# Patient Record
Sex: Female | Born: 1980
Health system: Southern US, Community
[De-identification: ages and names within clinical notes are randomized; demographics above are authoritative.]

## PROBLEM LIST (undated history)

## (undated) DIAGNOSIS — L409 Psoriasis, unspecified: Secondary | ICD-10-CM

## (undated) HISTORY — DX: Psoriasis, unspecified: L40.9

## (undated) HISTORY — PX: CHOLECYSTECTOMY: SHX55

## (undated) HISTORY — PX: OTHER SURGICAL HISTORY: SHX169

## (undated) HISTORY — PX: FOOT SURGERY: SHX648

---

## 2005-10-21 ENCOUNTER — Emergency Department: Payer: Self-pay | Admitting: Emergency Medicine

## 2007-06-03 ENCOUNTER — Inpatient Hospital Stay (HOSPITAL_COMMUNITY): Admission: EM | Admit: 2007-06-03 | Discharge: 2007-06-06 | Payer: Self-pay | Admitting: Emergency Medicine

## 2007-06-05 ENCOUNTER — Encounter (INDEPENDENT_AMBULATORY_CARE_PROVIDER_SITE_OTHER): Payer: Self-pay | Admitting: General Surgery

## 2007-12-11 ENCOUNTER — Other Ambulatory Visit: Admission: RE | Admit: 2007-12-11 | Discharge: 2007-12-11 | Payer: Self-pay | Admitting: Obstetrics and Gynecology

## 2009-05-04 ENCOUNTER — Other Ambulatory Visit: Admission: RE | Admit: 2009-05-04 | Discharge: 2009-05-04 | Payer: Self-pay | Admitting: Obstetrics and Gynecology

## 2009-10-27 ENCOUNTER — Emergency Department (HOSPITAL_COMMUNITY): Admission: EM | Admit: 2009-10-27 | Discharge: 2009-10-28 | Payer: Self-pay | Admitting: Emergency Medicine

## 2010-03-23 ENCOUNTER — Other Ambulatory Visit: Admission: RE | Admit: 2010-03-23 | Discharge: 2010-03-23 | Payer: Self-pay | Admitting: Obstetrics and Gynecology

## 2010-05-20 ENCOUNTER — Ambulatory Visit: Payer: Self-pay | Admitting: Advanced Practice Midwife

## 2010-08-16 ENCOUNTER — Inpatient Hospital Stay (HOSPITAL_COMMUNITY)
Admission: AD | Admit: 2010-08-16 | Discharge: 2010-08-16 | Payer: Self-pay | Source: Home / Self Care | Admitting: Family Medicine

## 2010-08-18 ENCOUNTER — Inpatient Hospital Stay (HOSPITAL_COMMUNITY): Admission: AD | Admit: 2010-08-18 | Discharge: 2010-05-20 | Payer: Self-pay | Admitting: Obstetrics & Gynecology

## 2010-09-24 ENCOUNTER — Inpatient Hospital Stay (HOSPITAL_COMMUNITY)
Admission: AD | Admit: 2010-09-24 | Discharge: 2010-09-26 | Payer: Self-pay | Source: Home / Self Care | Attending: Obstetrics & Gynecology | Admitting: Obstetrics & Gynecology

## 2010-09-26 LAB — CBC
HCT: 34.8 % — ABNORMAL LOW (ref 36.0–46.0)
Hemoglobin: 11.6 g/dL — ABNORMAL LOW (ref 12.0–15.0)
MCH: 28.4 pg (ref 26.0–34.0)
MCHC: 33.3 g/dL (ref 30.0–36.0)
MCV: 85.1 fL (ref 78.0–100.0)
Platelets: 213 10*3/uL (ref 150–400)
RBC: 4.09 MIL/uL (ref 3.87–5.11)
RDW: 14.3 % (ref 11.5–15.5)
WBC: 9.7 10*3/uL (ref 4.0–10.5)

## 2010-09-26 LAB — RPR: RPR Ser Ql: NONREACTIVE

## 2010-10-03 NOTE — Op Note (Signed)
Kaitlyn Carpenter, Kaitlyn Carpenter               ACCOUNT NO.:  [id number]  MEDICAL RECORD NO.:  [id number]          PATIENT TYPE:  INP  LOCATION:  9105                          FACILITY:  [facility]  PHYSICIAN:  Tanya S. Shawnie Pons, M.D.   DATE OF BIRTH:  04-24-1981  DATE OF PROCEDURE:  09/25/2010 DATE OF DISCHARGE:                              OPERATIVE REPORT   PREOPERATIVE DIAGNOSES: 1. Multiparity. 2. Undesired fertility.  POSTOPERATIVE DIAGNOSES: 1. Multiparity. 2. Undesired fertility. 3. Umbilical hernia.  PROCEDURES: 1. Postpartum tubal ligation with Filshie clips. 2. Umbilical hernia repair.  SURGEON:  Shelbie Proctor. Shawnie Pons, MD  ASSISTANT:  None.  ANESTHESIA:  Epidural local, Dana Allan, MD  FINDINGS:  Normal tubes and umbilical hernia.  SPECIMENS:  None.  COMPLICATIONS:  None known.  ESTIMATED BLOOD LOSS:  Minimal.  REASON FOR PROCEDURE:  Briefly, the patient is a 30 year old gravida 3, para 2 who was postpartum day #1 from her vaginal delivery.  She has had 3 children, does not desire any more.  She was counseled regarding risks, benefits of this procedure including permanency of the procedure, risk of failure 1:100, increased risk of ectopic.  Additionally, the patient had previous laparoscopic cholecystectomy and subsequent umbilical hernia that she would like repaired and was told that it could be fixed at the time of tubal ligation.  She was counseled about standard care being mesh for umbilical hernia, but that I would do our best to see what I can get closed, but I would not guarantee that whenever I did with last.  DESCRIPTION OF PROCEDURE:  The patient was taken to the OR.  She was placed in a supine position.  She was prepped and draped in usual sterile fashion.  A Foley catheter was placed inside her bladder. Epidural analgesia was dosed.  When it was adequate, she was injected with 7 mL of 0.25% Marcaine infraumbilically.  Two Allis clamps were used to elevate the skin  and a knife was used to make an incision. Incision was carried down through the underlying fascia and the peritoneal cavity which were entered sharply.  [employer] retractors were then used inside the abdomen and the patient was placed in Trendelenburg.  The patient's left tube was identified, grasped with a Babcock clamp, followed to its fimbriated end.  A Filshie clip was placed across this tube at approximately 1-1/2 cm from the cornu.  A 1 mL of Marcaine was drizzled over the tube.  Similarly, the patient's right tube was identified, grasped with a Babcock clamp, followed to its fimbriated end.  A Filshie clip was placed 1-1/2 cm from the cornu on this tube as well and 1 mL of 0.25% Marcaine was drizzled over this tube as well.  Both tubes were then allow to return to the abdomen and instruments were removed.  The patient was flattened.  Allis clamp was used to grasp either end of the fascia.  At this point, a defect at the umbilicus was noted above the superior edge of the fascial incision. This was grasped with a pickup and a figure-of-eight was placed through this incision with 0 Vicryl suture.  Similarly,  the fascial defect was closed with 0 Vicryl suture in a running fashion.  The skin was closed with 3-0 Vicryl in a subcuticular fashion.  All instrument and lap counts correct x2.  The patient was awakened and taken to the recovery room in stable condition.     Shelbie Proctor. Shawnie Pons, M.D.     TSP/MEDQ  D:  09/25/2010  T:  09/26/2010  Job:  [id number]  Electronically Signed by Tinnie Gens M.D. on 10/03/2010 12:08:43 PM

## 2010-11-22 ENCOUNTER — Encounter: Payer: Self-pay | Admitting: Orthopedic Surgery

## 2010-11-22 LAB — URINALYSIS, ROUTINE W REFLEX MICROSCOPIC
Bilirubin Urine: NEGATIVE
Glucose, UA: NEGATIVE mg/dL
Ketones, ur: >80 mg/dL — AB
Leukocytes, UA: NEGATIVE
Nitrite: NEGATIVE
Protein, ur: 30 mg/dL — AB
Specific Gravity, Urine: >1.03 — ABNORMAL HIGH (ref 1.005–1.030)
Urobilinogen, UA: 1 mg/dL (ref 0.0–1.0)
pH: 6 (ref 5.0–8.0)

## 2010-11-22 LAB — URINE MICROSCOPIC-ADD ON

## 2010-12-20 ENCOUNTER — Encounter: Payer: Self-pay | Admitting: Orthopedic Surgery

## 2010-12-20 ENCOUNTER — Ambulatory Visit: Payer: Self-pay | Admitting: Orthopedic Surgery

## 2011-01-24 NOTE — H&P (Signed)
NAMEYUNIQUE, DEARCOS NO.:  [id number]   MEDICAL RECORD NO.:  [id number]          PATIENT TYPE:  INP   LOCATION:  A313                          FACILITY:  [facility]   PHYSICIAN:  Barbaraann Barthel, M.D. DATE OF BIRTH:  16-Dec-1980   DATE OF ADMISSION:  06/03/2007  DATE OF DISCHARGE:  LH                              HISTORY & PHYSICAL   NOTE:  Surgery was asked to see this 30 year old white female for right  upper quadrant pain and nausea.  Chief complaint is that of right upper  quadrant pain with nausea for approximately 6 months in duration.  This  pain occurred 3 or 4 hours after eating and radiated to her back and was  accompanied with severe nausea.  This had recurred over the last 6  months, the worst episode being the one that occurred last evening,  which necessitated her admission through the emergency room today.  She  had a CT scan ordered by the emergency room physician.  This was normal  except that it did show some inflammation around the gallbladder with  stone noted.  A sonogram of the gallbladder was then performed and it  showed that the common bile duct was within normal limits and there were  at least two stones identified within the gallbladder.  Liver function  studies were also was grossly within normal limits.  Her AST was 19, ALT  17, alkaline phosphatase is 79 and total bilirubin 0.5.  White count was  9.9 with an H&H of 13.5 and 39.3.  her Metabolic-7 was within normal  limits and her urine pregnancy test was negative.  She did have some  epithelial cells and a small amount of pyuria in her urine.   Surgery was consulted.  We admitted the patient as the patient was very  tender in the right upper quadrant, and we will plan to do surgery on  her for gallbladder problems during this admission.   PHYSICAL EXAMINATION:  A pleasant 30 year old white female who is  uncomfortable but in no acute distress.  Head is normocephalic.  Eyes:  Extraocular  movements are intact.  Pupils  are round and react to light and accommodation.  There is no conjunctive  pallor or scleral injection.  Sclerae are of normal tincture.  Nose is  pierced, as is the tongue.  Otherwise the oral mucosa is moist.  NECK:  Supple and cylindrical without jugular vein distention,  thyromegaly or tracheal deviation.  There is no cervical adenopathy, and  no bruits are auscultated.  CHEST:  Clear both anterior and posterior to auscultation.  HEART:  Regular rhythm.  BREASTS AND AXILLAE:  Without masses.  ABDOMEN:  The patient is tender in the right upper quadrant.  The  patient has a scar around her umbilicus consistent with previous  piercing.  No femoral or inguinal hernias are appreciated.  RECTAL:  Not repeated.  This was done in the emergency room without any  findings.  EXTREMITIES:  No clubbing, cyanosis or varicosities.  The patient has  some tattoos.  Pulses are full and symmetrical.  REVIEW OF SYSTEMS:  GI SYSTEM:  Right upper quadrant pain and nausea of  6 months' duration.  No past history of hepatitis.  No history of  inflammatory bowel disease.  No history of bright red rectal bleeding,  black tarry stools, diarrhea or constipation.  GU SYSTEM:  No history of  nephrolithiasis or dysuria.  ENDOCRINE SYSTEM:  No history of thyroid  disease or diabetes.  CARDIORESPIRATORY SYSTEM:  Grossly within normal  limits.   The patient does smoke approximately half a pack of cigarettes per day.  She is a social drinker.  She does not use any recreational drugs.   MEDICATIONS:  None.   She is allergic to CEPHALOSPORINS.   OB/GYN HISTORY:  The patient is a gravida 2, para 2, abortus 0, cesarean  0, female whose pregnancy test is negative.   She had a maternal grandmother who had carcinoma of the breast.   IMPRESSION:  Ms. Maple Hudson is a 30 year old white female who has recurrent  gallbladder colic.  We have discussed surgery with her in detail,  discussing  complications not limited to but including bleeding,  infection, damage to bile ducts, perforation of organs, transitory  diarrhea, and the possibility that an open cholecystectomy may be  required.  Informed consent was obtained.  We will continue hydration.  She has been placed on antibiotics and we will plan for surgery during  this hospitalization.      Barbaraann Barthel, M.D.  Electronically Signed     WB/MEDQ  D:  06/03/2007  T:  06/04/2007  Job:  [id number]   cc:   Rhae Lerner. Margretta Ditty, M.D.  [address]  [place]  [place] [address]

## 2011-01-24 NOTE — Discharge Summary (Signed)
Kaitlyn Carpenter, Kaitlyn Carpenter               ACCOUNT NO.:  [id number]   MEDICAL RECORD NO.:  [id number]          PATIENT TYPE:  INP   LOCATION:  A313                          FACILITY:  [facility]   PHYSICIAN:  Barbaraann Barthel, M.D. DATE OF BIRTH:  12/28/1980   DATE OF ADMISSION:  06/03/2007  DATE OF DISCHARGE:  09/25/2008LH                               DISCHARGE SUMMARY   PROCEDURE:  On June 05, 2007, laparoscopic cholecystectomy.   DIAGNOSIS:  Cholecystitis, cholelithiasis.   SECONDARY DIAGNOSIS:  Allergic dermatitis secondary to poison oak.   Note, this is a 30 year old white female was admitted through the  emergency room with right upper quadrant pain and nausea for the last 6  months.  Sonogram revealed the presence of cholelithiasis.  She was she  was started on antibiotics and rehydrated and she was taken to surgery  on June 05, 2007, at which time laparoscopic cholecystectomy was  performed.  She did very well after surgery and was discharged on the  first postoperative day.  At the time of discharge her wounds were  clean.  She was voiding well.  She had no dysuria or leg pain or  shortness of breath and she had minimal incisional discomfort.   LABORATORY DATA:  As stated, sonography revealed the presence of stones.  A CT scan also revealed the presence of a stone and some inflammation.  Her liver function studies were grossly within normal limits, mild  elevation of AST, ALT.  However, her amylase and lipase were not  elevated and her bilirubin was normal.   HOSPITAL COURSE:  Unremarkable.  The patient did well.  Her diet and  activity were advanced as tolerated and she was doing well.  Her liver  function studies remained within normal limits postoperatively, and she  was discharged on the first postoperative day.  Incidentally, she had  just contracted allergic dermatitis from poison oak, just prior to her  admission, and this responded to Caladryl lotion and we have  discharged  her on over-the-counter steroid cream, and this appears to be doing  well.  We will follow up with her perioperatively after which she is  told to follow-up with her own doctor of her choosing.  She has no  physician that she sees on a regular basis.  We will follow up with her  perioperatively obviously.   DISCHARGE INSTRUCTIONS:  She is excused from work.  She is discharged on  a full liquid and soft diet.  She is told to increase her activity as  tolerated.  She is permitted to go up stairs and down stairs.  She is  permitted to  shower.  No heavy lifting, no driving, no sexual activities.  She is  discharged on Darvocet N-100 one tablet every 4 hours as needed for  pain, and she is to continue her Xanax 0.25 mg as needed and Benadryl 50  mg every 8 hours as needed for her for poison oak as well as her  cortisone cream.  We will follow her perioperatively.      Barbaraann Barthel, M.D.  Electronically Signed  WB/MEDQ  D:  06/06/2007  T:  06/07/2007  Job:  [id number]   cc:   Rhae Lerner. Margretta Ditty, M.D.  [address]  [place]  [place] [address]

## 2011-01-24 NOTE — Op Note (Signed)
NAMEHANH, KERTESZ NO.:  [id number]   MEDICAL RECORD NO.:  [id number]          PATIENT TYPE:  INP   LOCATION:  A313                          FACILITY:  [facility]   PHYSICIAN:  Barbaraann Barthel, M.D. DATE OF BIRTH:  [birth year]   DATE OF PROCEDURE:  06/05/2007  DATE OF DISCHARGE:                               OPERATIVE REPORT   SURGEON:  Barbaraann Barthel, M.D.   PREOPERATIVE DIAGNOSIS:  Cholecystitis, cholelithiasis.   POSTOPERATIVE DIAGNOSIS:  Cholecystitis, cholelithiasis.   PROCEDURE:  Laparoscopic cholecystectomy.   NOTE:  This is a 30 year old white female who had a six month history of  abdominal pain.  She was admitted through the emergency room with  biliary colic.  She was admitted, hydrated, antibiotics were initiated,  and her pain has subsided. Her liver function studies were within normal  limits.  Sonogram revealed the presence of gallstones.  As she improved  clinically, we scheduled her for surgery on June 05, 2007.  We  discussed the surgery with her in detail discussing complications not  limited to but including bleeding, infection, damage to bile ducts,  perforation of organs and transitory diarrhea.  Informed consent was  obtained.   GROSS OPERATIVE FINDINGS:  The patient had rather dense adhesions around  Hartmann's pouch, a small cystic duct which was not cannulated, at least  one stone within the gallbladder.  The right upper quadrant, otherwise,  appeared to be normal.  The gallbladder was intrahepatic in nature and  twisted with its adhesions.  Otherwise, the right upper quadrant  laparoscopically was grossly within normal limits.   SPECIMEN:  Gallbladder with stones.   TECHNIQUE:  The patient was placed in the supine position. After the  adequate administration of general anesthesia via endotracheal  intubation, her entire abdomen was prepped with Betadine solution and  draped in the usual manner.  Prior to this, a Foley  catheter was  aseptically inserted.  An incision was carried out over the superior  aspect of the umbilicus.  The skin and subcutaneous tissue was incised  down to the fascia which was elevated with a sharp towel clip. The  Veress needle was inserted and confirmed in position with a saline drop  test.  We then placed an 11 mm cannula using the Visiport technique  through the umbilicus and then under direct vision, an 11 mm cannula was  placed in the epigastrium and two 5 mm cannulae in the right upper  quadrant laterally.   The gallbladder was grasped.  Adhesions were taken down and we removed  the gallbladder after identifying clearly the cystic duct which was  triply silver clipped on the side of the common bile duct as well as the  cystic artery.  There was an inflamed lymph node of Calot present, as  well.  The gallbladder was then removed from the liver bed.  The  gallbladder was intrahepatic in nature and this was tedious dissection,  however, we did this without any spillage whatsoever. The gallbladder  was then removed using the Endosac device.  We irrigated with normal  saline.  I  elected to leave Surgicel within the liver bed and we then  desufflated the abdomen.  I did not feel it necessary to leave a drain.  We then desufflated the abdomen after checking for hemostasis and then  closed the 11 mm cannulae ports with 0 Polysorb suture using 0.5%  Sensorcaine at all port sites for comfort and then closed the skin with  the stapling device.   Prior to closure, all sponge, needle, and instrument counts were found  to be correct.  Estimated blood loss was minimal.  The patient received  800 mL crystalloids intraoperatively.  There were no complications.      Barbaraann Barthel, M.D.  Electronically Signed     WB/MEDQ  D:  06/05/2007  T:  06/05/2007  Job:  [id number]   cc:   Rhae Lerner. Margretta Ditty, M.D.  [address]  [place]  [place] [address]

## 2011-06-22 LAB — AMYLASE: Amylase: 47

## 2011-06-22 LAB — BASIC METABOLIC PANEL
BUN: 5 — ABNORMAL LOW
BUN: 9
CO2: 24
CO2: 25
Calcium: 8.6
Calcium: 9.4
Chloride: 107
Chloride: 110
Creatinine, Ser: 0.8
Creatinine, Ser: 0.86
GFR calc Af Amer: >60
GFR calc Af Amer: >60
GFR calc non Af Amer: >60
GFR calc non Af Amer: >60
Glucose, Bld: 100 — ABNORMAL HIGH
Glucose, Bld: 104 — ABNORMAL HIGH
Potassium: 4
Potassium: 4
Sodium: 138
Sodium: 140

## 2011-06-22 LAB — URINALYSIS, ROUTINE W REFLEX MICROSCOPIC
Bilirubin Urine: NEGATIVE
Glucose, UA: NEGATIVE
Ketones, ur: NEGATIVE
Leukocytes, UA: NEGATIVE
Nitrite: NEGATIVE
Protein, ur: NEGATIVE
Specific Gravity, Urine: 1.01
Urobilinogen, UA: 0.2
pH: 6

## 2011-06-22 LAB — DIFFERENTIAL
Basophils Absolute: 0
Basophils Absolute: 0
Basophils Relative: 0
Basophils Relative: 0
Eosinophils Absolute: 0.1
Eosinophils Absolute: 0.3
Eosinophils Relative: 1
Eosinophils Relative: 3
Lymphocytes Relative: 21
Lymphocytes Relative: 22
Lymphs Abs: 1.8
Lymphs Abs: 2.1
Monocytes Absolute: 0.6
Monocytes Absolute: 0.7
Monocytes Relative: 6
Monocytes Relative: 8
Neutro Abs: 5.8
Neutro Abs: 6.9
Neutrophils Relative %: 69
Neutrophils Relative %: 70

## 2011-06-22 LAB — HEPATIC FUNCTION PANEL
ALT: 15
ALT: 17
ALT: 32
AST: 13
AST: 19
AST: 34
Albumin: 3.2 — ABNORMAL LOW
Albumin: 3.2 — ABNORMAL LOW
Albumin: 4.2
Alkaline Phosphatase: 62
Alkaline Phosphatase: 72
Alkaline Phosphatase: 79
Bilirubin, Direct: 0.1
Bilirubin, Direct: 0.1
Bilirubin, Direct: 0.1
Indirect Bilirubin: 0.4
Indirect Bilirubin: 0.6
Indirect Bilirubin: 0.7
Total Bilirubin: 0.5
Total Bilirubin: 0.7
Total Bilirubin: 0.8
Total Protein: 5.2 — ABNORMAL LOW
Total Protein: 5.4 — ABNORMAL LOW
Total Protein: 6.9

## 2011-06-22 LAB — GONOCOCCUS DNA, PCR: GC Probe Amp, Genital: NEGATIVE

## 2011-06-22 LAB — URINE MICROSCOPIC-ADD ON

## 2011-06-22 LAB — CBC
HCT: 33.9 — ABNORMAL LOW
HCT: 39.3
Hemoglobin: 11.9 — ABNORMAL LOW
Hemoglobin: 13.5
MCHC: 34.3
MCHC: 35
MCV: 85.3
MCV: 85.6
Platelets: 171
Platelets: 214
RBC: 3.98
RBC: 4.59
RDW: 13.3
RDW: 13.5
WBC: 8.4
WBC: 9.9

## 2011-06-22 LAB — PREGNANCY, URINE: Preg Test, Ur: NEGATIVE

## 2011-09-03 ENCOUNTER — Emergency Department (HOSPITAL_COMMUNITY): Payer: Managed Care, Other (non HMO)

## 2011-09-03 ENCOUNTER — Emergency Department (HOSPITAL_COMMUNITY)
Admission: EM | Admit: 2011-09-03 | Discharge: 2011-09-04 | Disposition: A | Discharge status: Home / Self Care | Payer: Managed Care, Other (non HMO) | Attending: Emergency Medicine | Admitting: Emergency Medicine

## 2011-09-03 ENCOUNTER — Encounter: Payer: Self-pay | Admitting: *Deleted

## 2011-09-03 DIAGNOSIS — Y92009 Unspecified place in unspecified non-institutional (private) residence as the place of occurrence of the external cause: Secondary | ICD-10-CM | POA: Insufficient documentation

## 2011-09-03 DIAGNOSIS — M79609 Pain in unspecified limb: Secondary | ICD-10-CM | POA: Insufficient documentation

## 2011-09-03 DIAGNOSIS — S93409A Sprain of unspecified ligament of unspecified ankle, initial encounter: Secondary | ICD-10-CM | POA: Insufficient documentation

## 2011-09-03 DIAGNOSIS — M25579 Pain in unspecified ankle and joints of unspecified foot: Secondary | ICD-10-CM | POA: Insufficient documentation

## 2011-09-03 DIAGNOSIS — X500XXA Overexertion from strenuous movement or load, initial encounter: Secondary | ICD-10-CM | POA: Insufficient documentation

## 2011-09-03 DIAGNOSIS — F17200 Nicotine dependence, unspecified, uncomplicated: Secondary | ICD-10-CM | POA: Insufficient documentation

## 2011-09-03 NOTE — ED Notes (Signed)
Pt twisted right ankle. Pt states she heard it pop.

## 2011-09-04 ENCOUNTER — Encounter (HOSPITAL_COMMUNITY): Payer: Self-pay | Admitting: Emergency Medicine

## 2011-09-04 MED ORDER — HYDROCODONE-ACETAMINOPHEN 5-325 MG PO TABS
ORAL_TABLET | ORAL | Status: AC
  Administered 2011-09-04: 01:00:00
  Filled 2011-09-04: qty 2

## 2011-09-04 MED ORDER — HYDROCODONE-ACETAMINOPHEN 5-325 MG PO TABS: ORAL_TABLET | ORAL | Status: AC

## 2011-09-04 NOTE — ED Provider Notes (Signed)
History     CSN: [id number]  Arrival date & time 09/03/11  2244   First MD Initiated Contact with Patient 09/03/11 2334      Chief Complaint  Patient presents with  . Ankle Pain    (Consider location/radiation/quality/duration/timing/severity/associated sxs/prior treatment) HPI Comments: Patient c/o pain to her right lateral ankle after she was walking down steps at her home and her ankle twisted.  States she heard it "pop".  Now has pain with movement and weight bearing.  Denies numbness or other injuries.    Patient is a 30 y.o. female presenting with ankle pain. The history is provided by the patient.  Ankle Pain  The incident occurred 3 to 5 hours ago. The incident occurred at home. The injury mechanism was torsion. The pain is present in the right ankle. The quality of the pain is described as aching and throbbing. The pain is moderate. The pain has been constant since onset. Associated symptoms include inability to bear weight. Pertinent negatives include no numbness, no loss of motion, no muscle weakness, no loss of sensation and no tingling. She reports no foreign bodies present. The symptoms are aggravated by activity, bearing weight and palpation. She has tried nothing for the symptoms. The treatment provided no relief.    History reviewed. No pertinent past medical history.  Past Surgical History  Procedure Date  . Cholecystectomy   . Tubal ligation     History reviewed. No pertinent family history.  History  Substance Use Topics  . Smoking status: Current Everyday Smoker  . Smokeless tobacco: Not on file  . Alcohol Use: No    OB History    Grav Para Term Preterm Abortions TAB SAB Ect Mult Living                  Review of Systems  HENT: Negative for neck pain and neck stiffness.   Musculoskeletal: Positive for joint swelling and arthralgias. Negative for back pain.  Skin: Negative.   Neurological: Negative for tingling, weakness and numbness.  All other  systems reviewed and are negative.    Allergies  Cephalosporins  Home Medications  No current outpatient prescriptions on file.  BP 127/65  Pulse 106  Temp 99.2 F (37.3 C)  Resp 20  Ht 5\' 5"  (1.651 m)  Wt 155 lb (70.308 kg)  BMI 25.79 kg/m2  SpO2 100%  LMP 08/20/2011  Physical Exam  Nursing note and vitals reviewed. Constitutional: She is oriented to person, place, and time. She appears well-developed and well-nourished. No distress.  HENT:  Head: Normocephalic and atraumatic.  Mouth/Throat: No oropharyngeal exudate.  Neck: Normal range of motion. Neck supple.  Cardiovascular: Normal rate, regular rhythm and normal heart sounds.   Pulmonary/Chest: Effort normal and breath sounds normal.  Musculoskeletal: She exhibits edema and tenderness.       Right ankle: She exhibits swelling. She exhibits normal range of motion, no ecchymosis, no deformity, no laceration and normal pulse. tenderness. Lateral malleolus tenderness found. No medial malleolus, no head of 5th metatarsal and no proximal fibula tenderness found. Achilles tendon normal.  Neurological: She is alert and oriented to person, place, and time.  Skin: Skin is warm and dry.    ED Course  SPLINT APPLICATION Date/Time: 09/04/2011 12:26 AM Performed by: Trisha Mangle, Veatrice Eckstein L. Authorized by: Maxwell Caul Consent: Verbal consent obtained. Written consent not obtained. Consent given by: patient Patient understanding: patient states understanding of the procedure being performed Patient consent: the patient's understanding of the  procedure matches consent given Imaging studies: imaging studies available Patient identity confirmed: verbally with patient Time out: Immediately prior to procedure a "time out" was called to verify the correct patient, procedure, equipment, support staff and site/side marked as required. Location details: right ankle Splint type: aso splint. Supplies used: crutches. Post-procedure: The  splinted body part was neurovascularly unchanged following the procedure. Patient tolerance: Patient tolerated the procedure well with no immediate complications.   (including critical care time)  Labs Reviewed - No data to display Dg Ankle Complete Right  09/03/2011  *RADIOLOGY REPORT*  Clinical Data: Right lateral ankle swelling and pain, status post fall down stairs.  RIGHT ANKLE - COMPLETE 3+ VIEW  Comparison: Right ankle radiographs performed 10/28/2009  Findings: There is no evidence of fracture or dislocation.  The ankle mortise is intact; the interosseous space is within normal limits.  No talar tilt or subluxation is seen.  The joint spaces are preserved.  No significant soft tissue abnormalities are seen.  IMPRESSION: No evidence of fracture or dislocation.  Original Report Authenticated By: Tonia Ghent, M.D.   Dg Foot Complete Right  09/03/2011  *RADIOLOGY REPORT*  Clinical Data: Status post fall down stairs, with right proximal fifth metatarsal pain.  RIGHT FOOT COMPLETE - 3+ VIEW  Comparison: Right foot radiographs performed 02/17 1011  Findings: There is no evidence of fracture or dislocation.  The joint spaces are preserved.  There is no evidence of talar subluxation; the subtalar joint is unremarkable in appearance.  A prominent os naviculare is noted.  No significant soft tissue abnormalities are seen.  IMPRESSION:  1.  No evidence of fracture or dislocation. 2.  Os naviculare noted.  Original Report Authenticated By: Tonia Ghent, M.D.         MDM      ttp of the lateral right ankle.  No proximal tenderness.  dp pulse is brisk, sensation intact.  CR< 2 sec  Pt agrees to close orthopedic follow-up.        Bret Vanessen L. [place], [place] 09/04/11 [phone]

## 2011-09-06 NOTE — ED Provider Notes (Signed)
Evaluation and management procedures were performed by the PA/NP under my supervision/collaboration.   Taylynn Easton D Tiyon Sanor, MD 09/06/11 1530 

## 2013-02-10 ENCOUNTER — Encounter: Payer: Self-pay | Admitting: *Deleted

## 2013-02-10 ENCOUNTER — Other Ambulatory Visit: Payer: Self-pay | Admitting: Obstetrics & Gynecology

## 2014-05-05 ENCOUNTER — Encounter: Payer: Self-pay | Admitting: Obstetrics & Gynecology

## 2014-05-05 ENCOUNTER — Other Ambulatory Visit (HOSPITAL_COMMUNITY)
Admission: RE | Admit: 2014-05-05 | Discharge: 2014-05-05 | Disposition: A | Discharge status: Home / Self Care | Payer: BC Managed Care – PPO | Source: Ambulatory Visit | Attending: Obstetrics & Gynecology | Admitting: Obstetrics & Gynecology

## 2014-05-05 ENCOUNTER — Ambulatory Visit (INDEPENDENT_AMBULATORY_CARE_PROVIDER_SITE_OTHER): Payer: BC Managed Care – PPO | Admitting: Obstetrics & Gynecology

## 2014-05-05 VITALS — BP 120/68 | Ht 63.75 in | Wt 165.0 lb

## 2014-05-05 DIAGNOSIS — Z01419 Encounter for gynecological examination (general) (routine) without abnormal findings: Secondary | ICD-10-CM | POA: Diagnosis not present

## 2014-05-05 DIAGNOSIS — Z113 Encounter for screening for infections with a predominantly sexual mode of transmission: Secondary | ICD-10-CM | POA: Diagnosis present

## 2014-05-05 DIAGNOSIS — Z1151 Encounter for screening for human papillomavirus (HPV): Secondary | ICD-10-CM | POA: Insufficient documentation

## 2014-05-05 MED ORDER — DESOGESTREL-ETHINYL ESTRADIOL 0.15-30 MG-MCG PO TABS: 1.0000 | ORAL_TABLET | Freq: Every day | ORAL | Status: DC

## 2014-05-05 NOTE — Progress Notes (Signed)
Patient ID: Kaitlyn Carpenter, female   DOB: 1980/09/20, 33 y.o.   MRN: [id number] Subjective:     Kaitlyn Carpenter is a 33 y.o. female here for a routine exam.  Patient's last menstrual period was 04/27/2014. No obstetric history on file. Birth Control Method:  PP BTL  Menstrual Calendar(currently): regular heavier more painful  Current complaints: menses + PMS.   Current acute medical issues:  none   Recent Gynecologic History Patient's last menstrual period was 04/27/2014. Last Pap: 2012,  normal Last mammogram: ,    History reviewed. No pertinent past medical history.  Past Surgical History  Procedure Laterality Date  . Cholecystectomy    . Tubal ligation      OB History   Grav Para Term Preterm Abortions TAB SAB Ect Mult Living                  History   Social History  . Marital Status: Single    Spouse Name: N/A    Number of Children: N/A  . Years of Education: N/A   Social History Main Topics  . Smoking status: Current Every Day Smoker -- 1.00 packs/day  . Smokeless tobacco: None  . Alcohol Use: No  . Drug Use: No  . Sexual Activity: Yes   Other Topics Concern  . None   Social History Narrative  . None    Family History  Problem Relation Age of Onset  . Hypertension Mother   . Heart disease Father   . Hypertension Sister   . Cancer Maternal Grandmother     brest, cervicle, lung  . Diabetes Paternal Grandfather   . Kidney disease Paternal Grandfather      Review of Systems  Review of Systems  Constitutional: Negative for fever, chills, weight loss, malaise/fatigue and diaphoresis.  HENT: Negative for hearing loss, ear pain, nosebleeds, congestion, sore throat, neck pain, tinnitus and ear discharge.   Eyes: Negative for blurred vision, double vision, photophobia, pain, discharge and redness.  Respiratory: Negative for cough, hemoptysis, sputum production, shortness of breath, wheezing and stridor.   Cardiovascular: Negative for chest pain,  palpitations, orthopnea, claudication, leg swelling and PND.  Gastrointestinal: negative for abdominal pain. Negative for heartburn, nausea, vomiting, diarrhea, constipation, blood in stool and melena.  Genitourinary: Negative for dysuria, urgency, frequency, hematuria and flank pain.  Musculoskeletal: Negative for myalgias, back pain, joint pain and falls.  Skin: Negative for itching and rash.  Neurological: Negative for dizziness, tingling, tremors, sensory change, speech change, focal weakness, seizures, loss of consciousness, weakness and headaches.  Endo/Heme/Allergies: Negative for environmental allergies and polydipsia. Does not bruise/bleed easily.  Psychiatric/Behavioral: Negative for depression, suicidal ideas, hallucinations, memory loss and substance abuse. The patient is not nervous/anxious and does not have insomnia.        Objective:    Physical Exam  Vitals reviewed. Constitutional: She is oriented to person, place, and time. She appears well-developed and well-nourished.  HENT:  Head: Normocephalic and atraumatic.        Right Ear: External ear normal.  Left Ear: External ear normal.  Nose: Nose normal.  Mouth/Throat: Oropharynx is clear and moist.  Eyes: Conjunctivae and EOM are normal. Pupils are equal, round, and reactive to light. Right eye exhibits no discharge. Left eye exhibits no discharge. No scleral icterus.  Neck: Normal range of motion. Neck supple. No tracheal deviation present. No thyromegaly present.  Cardiovascular: Normal rate, regular rhythm, normal heart sounds and intact distal pulses.  Exam reveals no  gallop and no friction rub.   No murmur heard. Respiratory: Effort normal and breath sounds normal. No respiratory distress. She has no wheezes. She has no rales. She exhibits no tenderness.  GI: Soft. Bowel sounds are normal. She exhibits no distension and no mass. There is no tenderness. There is no rebound and no guarding.  Genitourinary:  Breasts no  masses skin changes or nipple changes bilaterally      Vulva is normal without lesions Vagina is pink moist without discharge Cervix normal in appearance and pap is done Uterus is normal size shape and contour Adnexa is negative with normal sized ovaries   Musculoskeletal: Normal range of motion. She exhibits no edema and no tenderness.  Neurological: She is alert and oriented to person, place, and time. She has normal reflexes. She displays normal reflexes. No cranial nerve deficit. She exhibits normal muscle tone. Coordination normal.  Skin: Skin is warm and dry. No rash noted. No erythema. No pallor.  Psychiatric: She has a normal mood and affect. Her behavior is normal. Judgment and thought content normal.       Assessment:    Healthy female exam.    Plan:    Contraception: OCP (estrogen/progesterone). Follow up in: 3 months.

## 2014-05-06 LAB — CYTOLOGY - PAP

## 2014-08-05 ENCOUNTER — Ambulatory Visit: Payer: BC Managed Care – PPO | Admitting: Obstetrics & Gynecology

## 2017-10-10 ENCOUNTER — Emergency Department (HOSPITAL_COMMUNITY)
Admission: EM | Admit: 2017-10-10 | Discharge: 2017-10-10 | Disposition: A | Discharge status: Home / Self Care | Payer: BLUE CROSS/BLUE SHIELD | Attending: Emergency Medicine | Admitting: Emergency Medicine

## 2017-10-10 ENCOUNTER — Encounter (HOSPITAL_COMMUNITY): Payer: Self-pay | Admitting: Emergency Medicine

## 2017-10-10 DIAGNOSIS — Z79899 Other long term (current) drug therapy: Secondary | ICD-10-CM | POA: Diagnosis not present

## 2017-10-10 DIAGNOSIS — Y33XXXA Other specified events, undetermined intent, initial encounter: Secondary | ICD-10-CM | POA: Insufficient documentation

## 2017-10-10 DIAGNOSIS — M542 Cervicalgia: Secondary | ICD-10-CM | POA: Insufficient documentation

## 2017-10-10 DIAGNOSIS — Y999 Unspecified external cause status: Secondary | ICD-10-CM | POA: Diagnosis not present

## 2017-10-10 DIAGNOSIS — S0911XA Strain of muscle and tendon of head, initial encounter: Secondary | ICD-10-CM | POA: Insufficient documentation

## 2017-10-10 DIAGNOSIS — R6884 Jaw pain: Secondary | ICD-10-CM | POA: Diagnosis present

## 2017-10-10 DIAGNOSIS — Y9389 Activity, other specified: Secondary | ICD-10-CM | POA: Insufficient documentation

## 2017-10-10 DIAGNOSIS — Y929 Unspecified place or not applicable: Secondary | ICD-10-CM | POA: Diagnosis not present

## 2017-10-10 DIAGNOSIS — F17200 Nicotine dependence, unspecified, uncomplicated: Secondary | ICD-10-CM | POA: Insufficient documentation

## 2017-10-10 MED ORDER — IBUPROFEN 800 MG PO TABS
800.0000 mg | ORAL_TABLET | Freq: Once | ORAL | Status: AC
  Administered 2017-10-10: 800 mg via ORAL
  Filled 2017-10-10: qty 1

## 2017-10-10 MED ORDER — IBUPROFEN 800 MG PO TABS: 800.0000 mg | ORAL_TABLET | Freq: Three times a day (TID) | ORAL | 0 refills | Status: DC

## 2017-10-10 NOTE — ED Provider Notes (Signed)
[facility] EMERGENCY DEPARTMENT Provider Note   CSN: [id number] Arrival date & time: 10/10/17  2024     History   Chief Complaint Chief Complaint  Patient presents with  . Dental Pain    HPI Kaitlyn Carpenter is a 37 y.o. female.  HPI  The patient is a 37 year old female who presents to the hospital stating that her right jaw became painful while she was eating pizza a couple of hours ago.  It was while she was chewing however she denies any dental pain, the pain is sharp, worse with chewing, radiates to her temple and down into her neck on the right side and is not associated with swelling, difficulty breathing or fevers.  She had had no symptoms earlier in the day whatsoever and was doing well until this happened acutely.  She took a hydrocodone in hopes that it would help, it took the edge off but she still has pain.  History reviewed. No pertinent past medical history.  There are no active problems to display for this patient.   Past Surgical History:  Procedure Laterality Date  . CHOLECYSTECTOMY    . FOOT SURGERY    . tubal ligation      OB History    No data available       Home Medications    Prior to Admission medications   Medication Sig Start Date End Date Taking? Authorizing Provider  desogestrel-ethinyl estradiol (APRI,EMOQUETTE,SOLIA) 0.15-30 MG-MCG tablet Take 1 tablet by mouth daily. 05/05/14   Lazaro ArmsEure, Luther H, MD  ibuprofen (ADVIL,MOTRIN) 800 MG tablet Take 1 tablet (800 mg total) by mouth 3 (three) times daily. 10/10/17   Eber HongMiller, Lashanti Chambless, MD    Family History Family History  Problem Relation Age of Onset  . Hypertension Mother   . Heart disease Father   . Hypertension Sister   . Cancer Maternal Grandmother        brest, cervicle, lung  . Diabetes Paternal Grandfather   . Kidney disease Paternal Grandfather     Social History Social History   Tobacco Use  . Smoking status: Current Every Day Smoker    Packs/day: 1.00  . Smokeless tobacco:  Never Used  Substance Use Topics  . Alcohol use: No  . Drug use: No     Allergies   Cephalosporins   Review of Systems Review of Systems  Constitutional: Negative for fever.  HENT: Negative for dental problem, ear pain, trouble swallowing and voice change.        Jaw pain  Respiratory: Negative for cough and shortness of breath.   Cardiovascular: Negative for chest pain.     Physical Exam Updated Vital Signs BP 137/81 (BP Location: Left Arm)   Pulse 81   Temp 97.9 F (36.6 C) (Oral)   Resp 17   Ht 5\' 4"  (1.626 m)   Wt 81.6 kg (180 lb)   LMP 09/23/2017   SpO2 100%   BMI 30.90 kg/m   Physical Exam  Constitutional:  No distress, well-appearing, no diaphoresis  HENT:  Normocephalic and atraumatic with normal-appearing symmetrical jaws, no trismus or torticollis, oropharynx is clear, mucous membranes are moist, no exudate on the tonsils, no hypertrophy, erythema, uvula is midline, no tenderness over the dentition in either the right or the left the upper or the bottom teeth.  No swelling of the jaw, no tenderness over the jaw bone.  TMJs appear symmetrical without the tenderness over the joint.  Tympanic membranes are clear bilaterally  Eyes:  Conjunctive are clear, pupils are equal round and reactive to light  Neck:  Supple neck with no lymphadenopathy, minimal tenderness in the right submandibular area with no lymphadenopathy there  Cardiovascular:  No tachycardia, normal heart sounds, no murmurs  Pulmonary/Chest:  Clear breath no wheezing rhonchi or rales, speaks in full sentences, no increased work of breathing sounds,  Neurological:  Clear speech, no slurring, normal phonation     ED Treatments / Results  Labs (all labs ordered are listed, but only abnormal results are displayed) Labs Reviewed - No data to display   Radiology No results found.  Procedures Procedures (including critical care time)  Medications Ordered in ED Medications  ibuprofen  (ADVIL,MOTRIN) tablet 800 mg (not administered)     Initial Impression / Assessment and Plan / ED Course  I have reviewed the triage vital signs and the nursing notes.  Pertinent labs & imaging results that were available during my care of the patient were reviewed by me and considered in my medical decision making (see chart for details).     The patient has some jaw pain, this area is right underneath that right cheek and is suspicious for masseter strain, less likely to be TMJ or dental injury given the normal intraoral exam and TMJ exam, no signs of infection, no exudate, normal phonation, doubt retropharyngeal abscess, peritonsillar abscess, does not seem to be consistent with sialolithiasis or parotitis.  Ibuprofen, warm compresses, home with close follow-up, patient is in agreement  Final Clinical Impressions(s) / ED Diagnoses   Final diagnoses:  Strain of masseter muscle, initial encounter    ED Discharge Orders        Ordered    ibuprofen (ADVIL,MOTRIN) 800 MG tablet  3 times daily     10/10/17 2136       Eber Hong, MD 10/10/17 2143

## 2017-10-10 NOTE — ED Triage Notes (Signed)
Pt reports sudden onset of RT sided dental pain that radiates to neck that began when she was eating pizza approx 1 hr ago.

## 2017-10-10 NOTE — Discharge Instructions (Signed)
Ibuprofen every 8 hours Keep either ice or warm compresses on this area Should improve over next week ER for worsening pain / swelling or fevers

## 2018-12-17 ENCOUNTER — Other Ambulatory Visit: Payer: BLUE CROSS/BLUE SHIELD | Admitting: Adult Health

## 2019-02-12 ENCOUNTER — Other Ambulatory Visit: Payer: BLUE CROSS/BLUE SHIELD | Admitting: Adult Health

## 2019-02-19 ENCOUNTER — Encounter: Payer: Self-pay | Admitting: Adult Health

## 2019-03-13 ENCOUNTER — Other Ambulatory Visit: Payer: BLUE CROSS/BLUE SHIELD | Admitting: Adult Health

## 2019-03-28 ENCOUNTER — Telehealth: Payer: Self-pay | Admitting: Adult Health

## 2019-03-28 NOTE — Telephone Encounter (Signed)

## 2019-03-31 ENCOUNTER — Other Ambulatory Visit (HOSPITAL_COMMUNITY)
Admission: RE | Admit: 2019-03-31 | Discharge: 2019-03-31 | Disposition: A | Discharge status: Home / Self Care | Payer: BLUE CROSS/BLUE SHIELD | Source: Ambulatory Visit | Attending: Adult Health | Admitting: Adult Health

## 2019-03-31 ENCOUNTER — Encounter: Payer: Self-pay | Admitting: Adult Health

## 2019-03-31 ENCOUNTER — Ambulatory Visit (INDEPENDENT_AMBULATORY_CARE_PROVIDER_SITE_OTHER): Payer: BLUE CROSS/BLUE SHIELD | Admitting: Adult Health

## 2019-03-31 ENCOUNTER — Other Ambulatory Visit: Payer: Self-pay

## 2019-03-31 VITALS — BP 126/70 | HR 79 | Ht 64.0 in | Wt 172.2 lb

## 2019-03-31 DIAGNOSIS — N390 Urinary tract infection, site not specified: Secondary | ICD-10-CM | POA: Insufficient documentation

## 2019-03-31 DIAGNOSIS — R319 Hematuria, unspecified: Secondary | ICD-10-CM | POA: Insufficient documentation

## 2019-03-31 DIAGNOSIS — Z01419 Encounter for gynecological examination (general) (routine) without abnormal findings: Secondary | ICD-10-CM | POA: Insufficient documentation

## 2019-03-31 DIAGNOSIS — R300 Dysuria: Secondary | ICD-10-CM | POA: Insufficient documentation

## 2019-03-31 DIAGNOSIS — L409 Psoriasis, unspecified: Secondary | ICD-10-CM | POA: Insufficient documentation

## 2019-03-31 DIAGNOSIS — N943 Premenstrual tension syndrome: Secondary | ICD-10-CM | POA: Insufficient documentation

## 2019-03-31 LAB — POCT URINALYSIS DIPSTICK
Glucose, UA: NEGATIVE
Nitrite, UA: POSITIVE
Protein, UA: NEGATIVE

## 2019-03-31 MED ORDER — SULFAMETHOXAZOLE-TRIMETHOPRIM 800-160 MG PO TABS: 1.0000 | ORAL_TABLET | Freq: Two times a day (BID) | ORAL | 0 refills | Status: AC

## 2019-03-31 MED ORDER — CLOBETASOL PROPIONATE 0.05 % EX OINT: 1.0000 "application " | TOPICAL_OINTMENT | Freq: Two times a day (BID) | CUTANEOUS | 3 refills | Status: AC

## 2019-03-31 MED ORDER — FLUOXETINE HCL 20 MG PO CAPS: ORAL_CAPSULE | ORAL | 3 refills | Status: AC

## 2019-03-31 NOTE — Progress Notes (Signed)
Patient ID: Kaitlyn Carpenter, female   DOB: 1981/02/21, 38 y.o.   MRN: [id number] History of Present Illness: Nikyla is a 38 year old white female,married, G3P3, in for a well woman gyn exam and pap. [place] pap 2015 No current PCP, will try to see Dr [place] Crane, her husband sees him.   Current Medications, Allergies, Past Medical History, Past Surgical History, Family History and Social History were reviewed in [employer] record.     Review of Systems: Patient denies any headaches, hearing loss, fatigue, blurred vision, shortness of breath, chest pain, abdominal pain, problems with bowel movements,  or intercourse. No joint pain. She has PMS and used to take prozac, which helped. She was treated for UTI recently and thinks It is back has discomfort Has pain LLQ every other month, ?ovulation Her psoriasis is worse.    Physical Exam:BP 126/70 (BP Location: Right Arm, Patient Position: Sitting, Cuff Size: Normal)   Pulse 79   Ht 5\' 4"  (1.626 m)   Wt 172 lb 3.2 oz (78.1 kg)   LMP 03/18/2019   BMI 29.56 kg/m   Urine dipstick was +nitrates, +blood and ketones  General:  Well developed, well nourished, no acute distress Skin:  Warm and dry, has areas of plaque psoriasis  She has +small sebaceous cyst left eye lid  Neck:  Midline trachea, normal thyroid, good ROM, no lymphadenopathy Lungs; Clear to auscultation bilaterally Breast:  No dominant palpable mass, retraction, or nipple discharge Cardiovascular: Regular rate and rhythm Abdomen:  Soft, non tender, no hepatosplenomegaly,has small umbilical hernia, she said they tried ot fix when had tubal  Pelvic:  External genitalia is normal in appearance, no lesions.  The vagina is normal in appearance. Urethra has no lesions or masses. The cervix is bulbous, slight everted at os, pap with GC/CHL and HPV with 16/18genotyping performed.Marland Kitchen  Uterus is felt to be normal size, shape, and contour.  No adnexal masses or tenderness  noted.Bladder is non tender, no masses felt. Extremities/musculoskeletal:  No swelling or varicosities noted, no clubbing or cyanosis, has psoriasis on both knees and elbows Psych:  No mood changes, alert and cooperative,seems happy Fall risk is low PHQ 2 score is 0 Examination chaperoned by Estill Bamberg Rash LPN.    Impression:  1. Encounter for gynecological examination with Papanicolaou smear of cervix   2. Dysuria   3. Urinary tract infection with hematuria, site unspecified   4. PMS (premenstrual syndrome)   5. Psoriasis      Plan: UA C&S sent Will treat for UTI Will rx prozac for PM  Will give temovate for psoriasis Meds ordered this encounter  Medications  . sulfamethoxazole-trimethoprim (BACTRIM DS) 800-160 MG tablet    Sig: Take 1 tablet by mouth 2 (two) times daily. Take 1 bid    Dispense:  14 tablet    Refill:  0    Order Specific Question:   Supervising Provider    Answer:   Elonda Husky, LUTHER H [[id number]]  . FLUoxetine (PROZAC) 20 MG capsule    Sig: Take 1 daily 2 weeks before period for PMS    Dispense:  15 capsule    Refill:  3    Order Specific Question:   Supervising Provider    Answer:   Elonda Husky, LUTHER H [[id number]]  . clobetasol ointment (TEMOVATE) 0.05 %    Sig: Apply 1 application topically 2 (two) times daily.    Dispense:  30 g    Refill:  3    Order  Specific Question:   Supervising Provider    Answer:   Lazaro ArmsEURE, LUTHER H [[id number]]  Encouraged to make appt with Dr Scharlene GlossHall's office Physical in 1 year Pap in 3 if normal Mammogram at 40 Follow up with me in 3 months

## 2019-04-01 LAB — URINALYSIS, ROUTINE W REFLEX MICROSCOPIC
Bilirubin, UA: NEGATIVE
Glucose, UA: NEGATIVE
Ketones, UA: NEGATIVE
Leukocytes,UA: NEGATIVE
Nitrite, UA: POSITIVE — AB
Protein,UA: NEGATIVE
Specific Gravity, UA: 1.022 (ref 1.005–1.030)
Urobilinogen, Ur: 0.2 mg/dL (ref 0.2–1.0)
pH, UA: 5.5 (ref 5.0–7.5)

## 2019-04-01 LAB — MICROSCOPIC EXAMINATION: Casts: NONE SEEN /lpf

## 2019-04-02 ENCOUNTER — Telehealth: Payer: Self-pay | Admitting: Adult Health

## 2019-04-02 LAB — URINE CULTURE

## 2019-04-02 LAB — CYTOLOGY - PAP
Chlamydia: NEGATIVE
Diagnosis: NEGATIVE
HPV: NOT DETECTED
Neisseria Gonorrhea: NEGATIVE

## 2019-04-02 MED ORDER — NITROFURANTOIN MONOHYD MACRO 100 MG PO CAPS: 100.0000 mg | ORAL_CAPSULE | Freq: Two times a day (BID) | ORAL | 0 refills | Status: DC

## 2019-04-02 NOTE — Telephone Encounter (Signed)
Pt aware + Ecoli, resistant to septra ds will change to Macrobid, push fluids

## 2019-04-16 DIAGNOSIS — F331 Major depressive disorder, recurrent, moderate: Secondary | ICD-10-CM | POA: Diagnosis not present

## 2019-04-16 DIAGNOSIS — H0264 Xanthelasma of left upper eyelid: Secondary | ICD-10-CM | POA: Diagnosis not present

## 2019-04-16 DIAGNOSIS — L409 Psoriasis, unspecified: Secondary | ICD-10-CM | POA: Diagnosis not present

## 2019-04-16 DIAGNOSIS — F3281 Premenstrual dysphoric disorder: Secondary | ICD-10-CM | POA: Diagnosis not present

## 2019-04-29 DIAGNOSIS — F331 Major depressive disorder, recurrent, moderate: Secondary | ICD-10-CM | POA: Diagnosis not present

## 2019-04-29 DIAGNOSIS — Z0189 Encounter for other specified special examinations: Secondary | ICD-10-CM | POA: Diagnosis not present

## 2019-04-29 DIAGNOSIS — E663 Overweight: Secondary | ICD-10-CM | POA: Diagnosis not present

## 2019-04-29 DIAGNOSIS — Z6829 Body mass index (BMI) 29.0-29.9, adult: Secondary | ICD-10-CM | POA: Diagnosis not present

## 2019-04-29 DIAGNOSIS — L409 Psoriasis, unspecified: Secondary | ICD-10-CM | POA: Diagnosis not present

## 2019-04-29 DIAGNOSIS — H0264 Xanthelasma of left upper eyelid: Secondary | ICD-10-CM | POA: Diagnosis not present

## 2019-04-29 DIAGNOSIS — F3281 Premenstrual dysphoric disorder: Secondary | ICD-10-CM | POA: Diagnosis not present

## 2019-05-01 DIAGNOSIS — L409 Psoriasis, unspecified: Secondary | ICD-10-CM | POA: Diagnosis not present

## 2019-05-01 DIAGNOSIS — Z0001 Encounter for general adult medical examination with abnormal findings: Secondary | ICD-10-CM | POA: Diagnosis not present

## 2019-05-01 DIAGNOSIS — E663 Overweight: Secondary | ICD-10-CM | POA: Diagnosis not present

## 2019-05-01 DIAGNOSIS — Z712 Person consulting for explanation of examination or test findings: Secondary | ICD-10-CM | POA: Diagnosis not present

## 2019-05-01 DIAGNOSIS — H0264 Xanthelasma of left upper eyelid: Secondary | ICD-10-CM | POA: Diagnosis not present

## 2019-05-01 DIAGNOSIS — F3281 Premenstrual dysphoric disorder: Secondary | ICD-10-CM | POA: Diagnosis not present

## 2019-05-01 DIAGNOSIS — E782 Mixed hyperlipidemia: Secondary | ICD-10-CM | POA: Diagnosis not present

## 2019-05-01 DIAGNOSIS — Z0189 Encounter for other specified special examinations: Secondary | ICD-10-CM | POA: Diagnosis not present

## 2019-07-01 ENCOUNTER — Ambulatory Visit: Payer: BLUE CROSS/BLUE SHIELD | Admitting: Adult Health

## 2019-08-03 DIAGNOSIS — N390 Urinary tract infection, site not specified: Secondary | ICD-10-CM | POA: Diagnosis not present

## 2019-08-03 DIAGNOSIS — R3989 Other symptoms and signs involving the genitourinary system: Secondary | ICD-10-CM | POA: Diagnosis not present

## 2019-08-06 DIAGNOSIS — E782 Mixed hyperlipidemia: Secondary | ICD-10-CM | POA: Diagnosis not present

## 2019-08-12 DIAGNOSIS — L409 Psoriasis, unspecified: Secondary | ICD-10-CM | POA: Diagnosis not present

## 2019-08-12 DIAGNOSIS — F3281 Premenstrual dysphoric disorder: Secondary | ICD-10-CM | POA: Diagnosis not present

## 2019-08-12 DIAGNOSIS — E663 Overweight: Secondary | ICD-10-CM | POA: Diagnosis not present

## 2019-08-12 DIAGNOSIS — H0264 Xanthelasma of left upper eyelid: Secondary | ICD-10-CM | POA: Diagnosis not present

## 2019-08-12 DIAGNOSIS — Z716 Tobacco abuse counseling: Secondary | ICD-10-CM | POA: Diagnosis not present

## 2019-08-13 DIAGNOSIS — E782 Mixed hyperlipidemia: Secondary | ICD-10-CM | POA: Diagnosis not present

## 2019-08-13 DIAGNOSIS — K429 Umbilical hernia without obstruction or gangrene: Secondary | ICD-10-CM | POA: Diagnosis not present

## 2019-08-13 DIAGNOSIS — Z0189 Encounter for other specified special examinations: Secondary | ICD-10-CM | POA: Diagnosis not present

## 2019-08-13 DIAGNOSIS — E663 Overweight: Secondary | ICD-10-CM | POA: Diagnosis not present

## 2019-09-08 DIAGNOSIS — R509 Fever, unspecified: Secondary | ICD-10-CM | POA: Diagnosis not present

## 2019-09-08 DIAGNOSIS — Z20828 Contact with and (suspected) exposure to other viral communicable diseases: Secondary | ICD-10-CM | POA: Diagnosis not present

## 2019-09-16 DIAGNOSIS — L4050 Arthropathic psoriasis, unspecified: Secondary | ICD-10-CM | POA: Diagnosis not present

## 2019-09-24 DIAGNOSIS — R5382 Chronic fatigue, unspecified: Secondary | ICD-10-CM | POA: Diagnosis not present

## 2019-09-24 DIAGNOSIS — L4059 Other psoriatic arthropathy: Secondary | ICD-10-CM | POA: Diagnosis not present

## 2019-09-24 DIAGNOSIS — L401 Generalized pustular psoriasis: Secondary | ICD-10-CM | POA: Diagnosis not present

## 2019-09-24 DIAGNOSIS — M2550 Pain in unspecified joint: Secondary | ICD-10-CM | POA: Diagnosis not present

## 2019-12-02 DIAGNOSIS — U071 COVID-19: Secondary | ICD-10-CM | POA: Diagnosis not present

## 2019-12-03 DIAGNOSIS — F3281 Premenstrual dysphoric disorder: Secondary | ICD-10-CM | POA: Diagnosis not present

## 2019-12-03 DIAGNOSIS — F330 Major depressive disorder, recurrent, mild: Secondary | ICD-10-CM | POA: Diagnosis not present

## 2019-12-03 DIAGNOSIS — E782 Mixed hyperlipidemia: Secondary | ICD-10-CM | POA: Diagnosis not present

## 2019-12-03 DIAGNOSIS — E663 Overweight: Secondary | ICD-10-CM | POA: Diagnosis not present

## 2020-01-30 DIAGNOSIS — R6889 Other general symptoms and signs: Secondary | ICD-10-CM | POA: Diagnosis not present

## 2020-01-30 DIAGNOSIS — H6690 Otitis media, unspecified, unspecified ear: Secondary | ICD-10-CM | POA: Diagnosis not present

## 2020-01-30 DIAGNOSIS — R05 Cough: Secondary | ICD-10-CM | POA: Diagnosis not present

## 2020-05-19 DIAGNOSIS — Z630 Problems in relationship with spouse or partner: Secondary | ICD-10-CM | POA: Diagnosis not present

## 2020-05-19 DIAGNOSIS — F331 Major depressive disorder, recurrent, moderate: Secondary | ICD-10-CM | POA: Diagnosis not present

## 2020-05-19 DIAGNOSIS — F411 Generalized anxiety disorder: Secondary | ICD-10-CM | POA: Diagnosis not present

## 2020-05-26 DIAGNOSIS — Z630 Problems in relationship with spouse or partner: Secondary | ICD-10-CM | POA: Diagnosis not present

## 2020-05-26 DIAGNOSIS — F411 Generalized anxiety disorder: Secondary | ICD-10-CM | POA: Diagnosis not present

## 2020-05-26 DIAGNOSIS — F331 Major depressive disorder, recurrent, moderate: Secondary | ICD-10-CM | POA: Diagnosis not present

## 2020-06-09 DIAGNOSIS — F411 Generalized anxiety disorder: Secondary | ICD-10-CM | POA: Diagnosis not present

## 2020-06-09 DIAGNOSIS — Z630 Problems in relationship with spouse or partner: Secondary | ICD-10-CM | POA: Diagnosis not present

## 2020-06-09 DIAGNOSIS — F331 Major depressive disorder, recurrent, moderate: Secondary | ICD-10-CM | POA: Diagnosis not present

## 2020-06-16 DIAGNOSIS — F331 Major depressive disorder, recurrent, moderate: Secondary | ICD-10-CM | POA: Diagnosis not present

## 2020-06-16 DIAGNOSIS — Z630 Problems in relationship with spouse or partner: Secondary | ICD-10-CM | POA: Diagnosis not present

## 2020-06-16 DIAGNOSIS — F411 Generalized anxiety disorder: Secondary | ICD-10-CM | POA: Diagnosis not present

## 2020-06-26 DIAGNOSIS — Z630 Problems in relationship with spouse or partner: Secondary | ICD-10-CM | POA: Diagnosis not present

## 2020-06-26 DIAGNOSIS — F331 Major depressive disorder, recurrent, moderate: Secondary | ICD-10-CM | POA: Diagnosis not present

## 2020-06-26 DIAGNOSIS — F411 Generalized anxiety disorder: Secondary | ICD-10-CM | POA: Diagnosis not present

## 2020-06-28 DIAGNOSIS — F331 Major depressive disorder, recurrent, moderate: Secondary | ICD-10-CM | POA: Diagnosis not present

## 2020-06-28 DIAGNOSIS — F411 Generalized anxiety disorder: Secondary | ICD-10-CM | POA: Diagnosis not present

## 2020-06-28 DIAGNOSIS — Z630 Problems in relationship with spouse or partner: Secondary | ICD-10-CM | POA: Diagnosis not present

## 2020-06-30 DIAGNOSIS — J029 Acute pharyngitis, unspecified: Secondary | ICD-10-CM | POA: Diagnosis not present

## 2020-06-30 DIAGNOSIS — R350 Frequency of micturition: Secondary | ICD-10-CM | POA: Diagnosis not present

## 2020-06-30 DIAGNOSIS — N390 Urinary tract infection, site not specified: Secondary | ICD-10-CM | POA: Diagnosis not present

## 2020-06-30 DIAGNOSIS — J069 Acute upper respiratory infection, unspecified: Secondary | ICD-10-CM | POA: Diagnosis not present

## 2020-07-08 DIAGNOSIS — F331 Major depressive disorder, recurrent, moderate: Secondary | ICD-10-CM | POA: Diagnosis not present

## 2020-07-08 DIAGNOSIS — F319 Bipolar disorder, unspecified: Secondary | ICD-10-CM | POA: Diagnosis not present

## 2020-07-08 DIAGNOSIS — F411 Generalized anxiety disorder: Secondary | ICD-10-CM | POA: Diagnosis not present

## 2020-07-12 DIAGNOSIS — F331 Major depressive disorder, recurrent, moderate: Secondary | ICD-10-CM | POA: Diagnosis not present

## 2020-07-12 DIAGNOSIS — Z630 Problems in relationship with spouse or partner: Secondary | ICD-10-CM | POA: Diagnosis not present

## 2020-07-12 DIAGNOSIS — F411 Generalized anxiety disorder: Secondary | ICD-10-CM | POA: Diagnosis not present

## 2020-07-21 DIAGNOSIS — J0100 Acute maxillary sinusitis, unspecified: Secondary | ICD-10-CM | POA: Diagnosis not present

## 2020-07-21 DIAGNOSIS — Z720 Tobacco use: Secondary | ICD-10-CM | POA: Diagnosis not present

## 2020-07-26 DIAGNOSIS — F411 Generalized anxiety disorder: Secondary | ICD-10-CM | POA: Diagnosis not present

## 2020-07-26 DIAGNOSIS — F319 Bipolar disorder, unspecified: Secondary | ICD-10-CM | POA: Diagnosis not present

## 2020-07-26 DIAGNOSIS — Z79899 Other long term (current) drug therapy: Secondary | ICD-10-CM | POA: Diagnosis not present

## 2020-07-29 DIAGNOSIS — F411 Generalized anxiety disorder: Secondary | ICD-10-CM | POA: Diagnosis not present

## 2020-07-29 DIAGNOSIS — F319 Bipolar disorder, unspecified: Secondary | ICD-10-CM | POA: Diagnosis not present

## 2020-07-29 DIAGNOSIS — Z79899 Other long term (current) drug therapy: Secondary | ICD-10-CM | POA: Diagnosis not present

## 2020-08-16 DIAGNOSIS — F319 Bipolar disorder, unspecified: Secondary | ICD-10-CM | POA: Diagnosis not present

## 2020-08-16 DIAGNOSIS — F411 Generalized anxiety disorder: Secondary | ICD-10-CM | POA: Diagnosis not present

## 2020-09-14 DIAGNOSIS — G4700 Insomnia, unspecified: Secondary | ICD-10-CM | POA: Diagnosis not present

## 2020-09-14 DIAGNOSIS — F319 Bipolar disorder, unspecified: Secondary | ICD-10-CM | POA: Diagnosis not present

## 2020-09-14 DIAGNOSIS — F411 Generalized anxiety disorder: Secondary | ICD-10-CM | POA: Diagnosis not present

## 2020-10-12 DIAGNOSIS — G4700 Insomnia, unspecified: Secondary | ICD-10-CM | POA: Diagnosis not present

## 2020-10-12 DIAGNOSIS — F319 Bipolar disorder, unspecified: Secondary | ICD-10-CM | POA: Diagnosis not present

## 2020-10-12 DIAGNOSIS — F411 Generalized anxiety disorder: Secondary | ICD-10-CM | POA: Diagnosis not present

## 2020-11-09 DIAGNOSIS — F411 Generalized anxiety disorder: Secondary | ICD-10-CM | POA: Diagnosis not present

## 2020-11-09 DIAGNOSIS — G4700 Insomnia, unspecified: Secondary | ICD-10-CM | POA: Diagnosis not present

## 2020-11-09 DIAGNOSIS — F319 Bipolar disorder, unspecified: Secondary | ICD-10-CM | POA: Diagnosis not present

## 2021-05-08 DIAGNOSIS — S6992XA Unspecified injury of left wrist, hand and finger(s), initial encounter: Secondary | ICD-10-CM | POA: Diagnosis not present

## 2021-05-19 DIAGNOSIS — R634 Abnormal weight loss: Secondary | ICD-10-CM | POA: Diagnosis not present

## 2021-05-19 DIAGNOSIS — R591 Generalized enlarged lymph nodes: Secondary | ICD-10-CM | POA: Diagnosis not present

## 2021-05-19 DIAGNOSIS — B370 Candidal stomatitis: Secondary | ICD-10-CM | POA: Diagnosis not present

## 2021-05-31 ENCOUNTER — Other Ambulatory Visit: Payer: Self-pay | Admitting: Family Medicine

## 2021-05-31 ENCOUNTER — Other Ambulatory Visit (HOSPITAL_COMMUNITY): Payer: Self-pay | Admitting: Family Medicine

## 2021-05-31 DIAGNOSIS — R591 Generalized enlarged lymph nodes: Secondary | ICD-10-CM

## 2021-06-07 ENCOUNTER — Ambulatory Visit (HOSPITAL_COMMUNITY)
Admission: RE | Admit: 2021-06-07 | Discharge: 2021-06-07 | Disposition: A | Discharge status: Home / Self Care | Payer: BC Managed Care – PPO | Source: Ambulatory Visit | Attending: Family Medicine | Admitting: Family Medicine

## 2021-06-07 ENCOUNTER — Other Ambulatory Visit: Payer: Self-pay

## 2021-06-07 DIAGNOSIS — R222 Localized swelling, mass and lump, trunk: Secondary | ICD-10-CM | POA: Diagnosis not present

## 2021-06-07 DIAGNOSIS — R591 Generalized enlarged lymph nodes: Secondary | ICD-10-CM | POA: Insufficient documentation

## 2021-06-22 DIAGNOSIS — L400 Psoriasis vulgaris: Secondary | ICD-10-CM | POA: Diagnosis not present

## 2021-06-22 DIAGNOSIS — F17210 Nicotine dependence, cigarettes, uncomplicated: Secondary | ICD-10-CM | POA: Diagnosis not present

## 2021-06-22 DIAGNOSIS — Z716 Tobacco abuse counseling: Secondary | ICD-10-CM | POA: Diagnosis not present

## 2021-07-25 DIAGNOSIS — L4050 Arthropathic psoriasis, unspecified: Secondary | ICD-10-CM | POA: Diagnosis not present

## 2021-07-25 DIAGNOSIS — L400 Psoriasis vulgaris: Secondary | ICD-10-CM | POA: Diagnosis not present

## 2021-07-25 DIAGNOSIS — B002 Herpesviral gingivostomatitis and pharyngotonsillitis: Secondary | ICD-10-CM | POA: Diagnosis not present

## 2021-10-26 DIAGNOSIS — N393 Stress incontinence (female) (male): Secondary | ICD-10-CM | POA: Diagnosis not present

## 2021-10-26 DIAGNOSIS — L400 Psoriasis vulgaris: Secondary | ICD-10-CM | POA: Diagnosis not present

## 2022-02-14 IMAGING — US US SOFT TISSUE HEAD/NECK
1 series · 4 of 4 positions shown · non-contrast
Comparison: None

CLINICAL DATA: RIGHT supraclavicular palpable abnormality question
lymphadenopathy

EXAM:
CHEST ULTRASOUND

[Series 1: us soft tissue head & neck (non-thyroid) · 4 acquisitions, 4 frames shown]
[im 1/4]
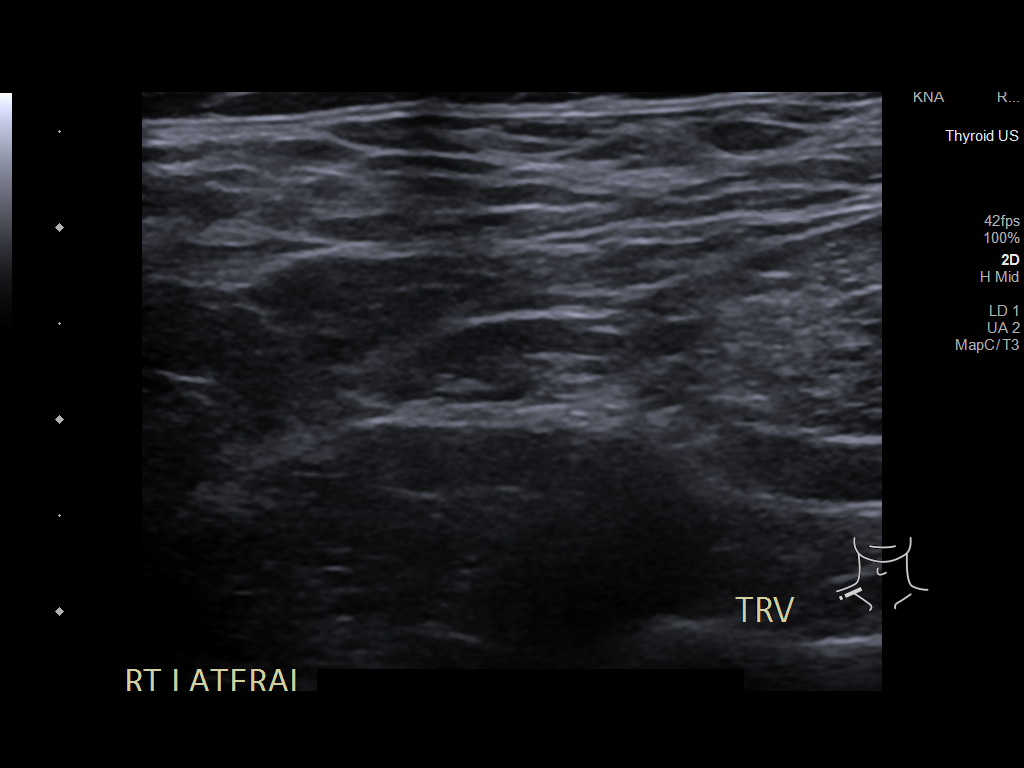
[im 2/4]
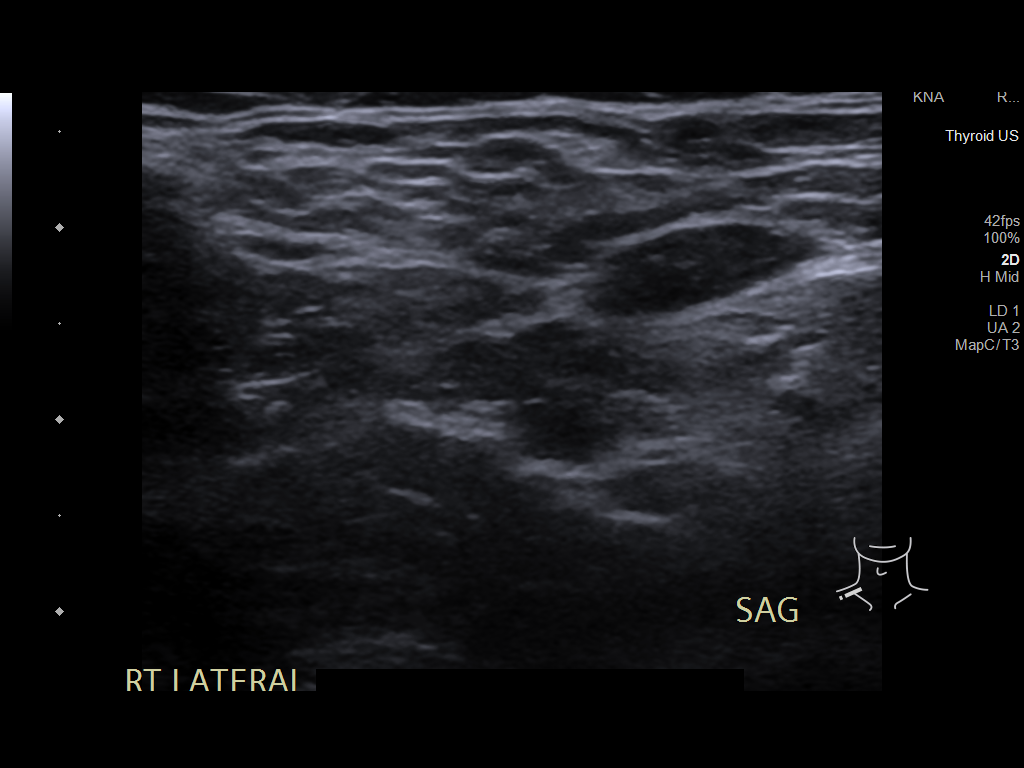
[im 3/4]
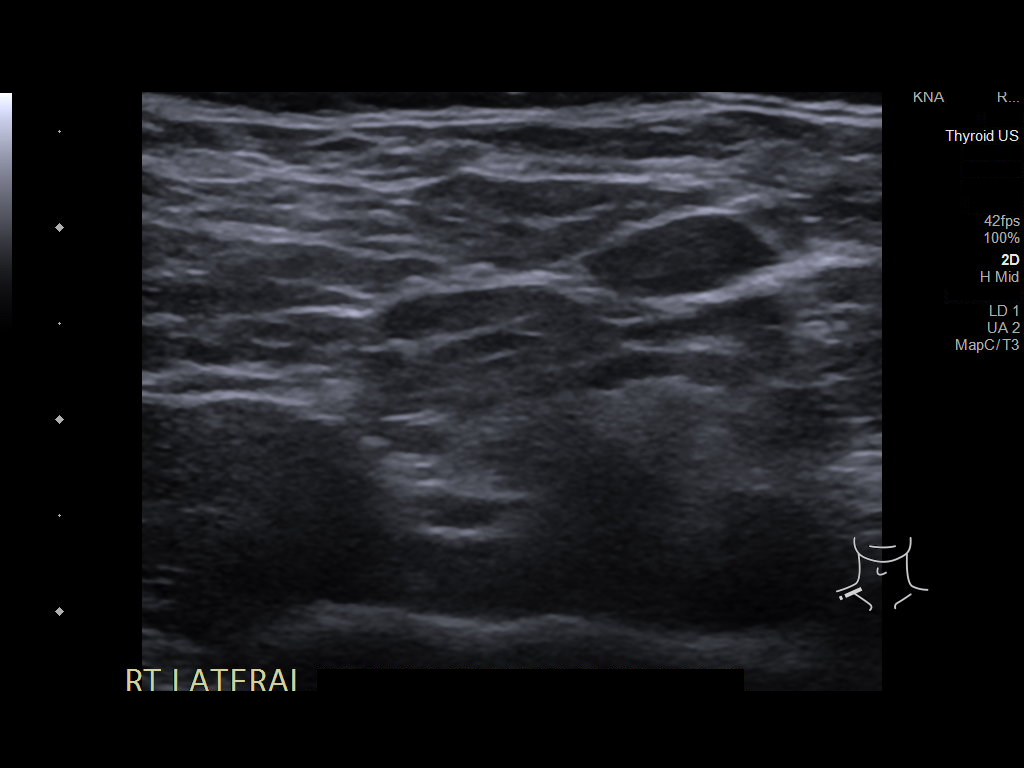
[im 4/4]
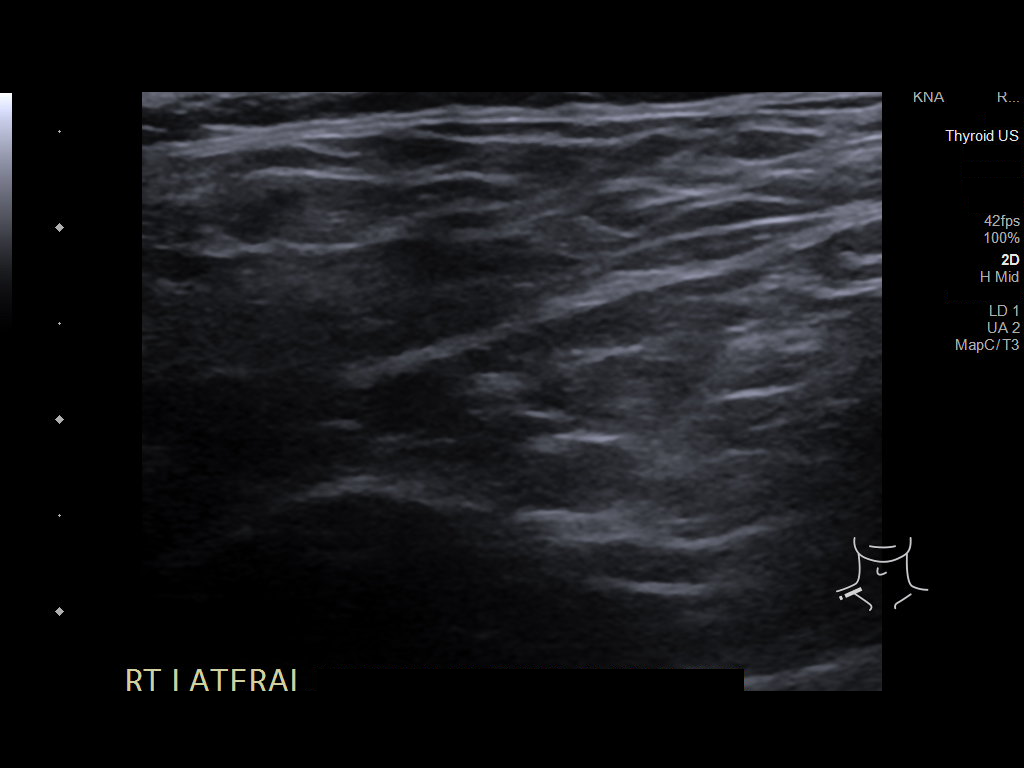

[4 of 4 positions shown; findings below may reference images not displayed]

FINDINGS: Sonography performed at the site of clinical concern.

No soft tissue mass, fluid collection or adenopathy identified.

I am unable to palpate a discrete mass at this site, or localize a
focal sonographic finding at the site.
IMPRESSION: Negative ultrasound of the site of clinical concern at the RIGHT
supraclavicular region.

## 2022-06-02 DIAGNOSIS — Z5181 Encounter for therapeutic drug level monitoring: Secondary | ICD-10-CM | POA: Diagnosis not present

## 2022-06-02 DIAGNOSIS — L400 Psoriasis vulgaris: Secondary | ICD-10-CM | POA: Diagnosis not present

## 2022-06-02 DIAGNOSIS — Z7189 Other specified counseling: Secondary | ICD-10-CM | POA: Diagnosis not present

## 2022-08-29 DIAGNOSIS — U071 COVID-19: Secondary | ICD-10-CM | POA: Diagnosis not present

## 2022-08-29 DIAGNOSIS — J020 Streptococcal pharyngitis: Secondary | ICD-10-CM | POA: Diagnosis not present

## 2022-08-29 DIAGNOSIS — R070 Pain in throat: Secondary | ICD-10-CM | POA: Diagnosis not present

## 2022-09-27 ENCOUNTER — Emergency Department (HOSPITAL_COMMUNITY): Payer: BC Managed Care – PPO

## 2022-09-27 ENCOUNTER — Emergency Department (HOSPITAL_COMMUNITY)
Admission: EM | Admit: 2022-09-27 | Discharge: 2022-09-27 | Disposition: A | Discharge status: Home / Self Care | Payer: BC Managed Care – PPO | Attending: Emergency Medicine | Admitting: Emergency Medicine

## 2022-09-27 ENCOUNTER — Encounter (HOSPITAL_COMMUNITY): Payer: Self-pay

## 2022-09-27 ENCOUNTER — Other Ambulatory Visit: Payer: Self-pay

## 2022-09-27 DIAGNOSIS — J069 Acute upper respiratory infection, unspecified: Secondary | ICD-10-CM | POA: Diagnosis not present

## 2022-09-27 DIAGNOSIS — Z1152 Encounter for screening for COVID-19: Secondary | ICD-10-CM | POA: Diagnosis not present

## 2022-09-27 DIAGNOSIS — N3000 Acute cystitis without hematuria: Secondary | ICD-10-CM | POA: Diagnosis not present

## 2022-09-27 DIAGNOSIS — B9789 Other viral agents as the cause of diseases classified elsewhere: Secondary | ICD-10-CM | POA: Diagnosis not present

## 2022-09-27 DIAGNOSIS — R059 Cough, unspecified: Secondary | ICD-10-CM | POA: Diagnosis not present

## 2022-09-27 LAB — PREGNANCY, URINE: Preg Test, Ur: NEGATIVE

## 2022-09-27 LAB — URINALYSIS, ROUTINE W REFLEX MICROSCOPIC
Bilirubin Urine: NEGATIVE
Glucose, UA: NEGATIVE mg/dL
Ketones, ur: NEGATIVE mg/dL
Nitrite: POSITIVE — AB
Protein, ur: 100 mg/dL — AB
Renal Epithelial: 1
Specific Gravity, Urine: 1.011 (ref 1.005–1.030)
WBC, UA: >50 WBC/hpf — ABNORMAL HIGH (ref 0–5)
pH: 6 (ref 5.0–8.0)

## 2022-09-27 LAB — RESP PANEL BY RT-PCR (RSV, FLU A&B, COVID)  RVPGX2
Influenza A by PCR: NEGATIVE
Influenza B by PCR: NEGATIVE
Resp Syncytial Virus by PCR: NEGATIVE
SARS Coronavirus 2 by RT PCR: NEGATIVE

## 2022-09-27 MED ORDER — ALBUTEROL SULFATE HFA 108 (90 BASE) MCG/ACT IN AERS: 1.0000 | INHALATION_SPRAY | Freq: Four times a day (QID) | RESPIRATORY_TRACT | 0 refills | Status: AC | PRN

## 2022-09-27 MED ORDER — IPRATROPIUM-ALBUTEROL 0.5-2.5 (3) MG/3ML IN SOLN
3.0000 mL | Freq: Once | RESPIRATORY_TRACT | Status: AC
  Administered 2022-09-27: 3 mL via RESPIRATORY_TRACT
  Filled 2022-09-27: qty 3

## 2022-09-27 MED ORDER — NITROFURANTOIN MONOHYD MACRO 100 MG PO CAPS: 100.0000 mg | ORAL_CAPSULE | Freq: Two times a day (BID) | ORAL | 0 refills | Status: AC

## 2022-09-27 MED ORDER — ACETAMINOPHEN 325 MG PO TABS
650.0000 mg | ORAL_TABLET | Freq: Once | ORAL | Status: AC | PRN
  Administered 2022-09-27: 650 mg via ORAL
  Filled 2022-09-27: qty 2

## 2022-09-27 NOTE — ED Provider Notes (Addendum)
[facility] EMERGENCY DEPARTMENT Provider Note   CSN: [id number] Arrival date & time: 09/27/22  1730     History Chief Complaint  Patient presents with   flu like symptoms    Kaitlyn Carpenter is a 42 y.o. female with history of hyperlipidemia and psoriatic arthritis presents the emergency room today for evaluation of flulike symptoms since yesterday.  Patient reports she has had cough with diffuse bodyaches, headache, and a congestion, subjective fever, some nausea but no vomiting or abdominal pain.  No chest pain, mentions some mild shortness of breath.  She tried to ibuprofen prior to arrival but had no relief of symptoms.  She also mentions having some urinary symptoms with some dysuria and urgency frequency over the past few days.  She reports this feels her typical UTI.  Additionally, she does have some left-sided facial droop.  She reports that she has had Bell's palsy for the past 15 to 20 years and that it usually worsens whenever she is tired or ill.  She does not have any pain to movement of her eyes or any blurry vision.  HPI     Home Medications Prior to Admission medications   Medication Sig Start Date End Date Taking? Authorizing Provider  clobetasol ointment (TEMOVATE) 4.58 % Apply 1 application topically 2 (two) times daily. 03/31/19   Estill Dooms, NP  FLUoxetine (PROZAC) 20 MG capsule Take 1 daily 2 weeks before period for PMS 03/31/19   Derrek Monaco A, NP  nitrofurantoin, macrocrystal-monohydrate, (MACROBID) 100 MG capsule Take 1 capsule (100 mg total) by mouth 2 (two) times daily. 04/02/19   Estill Dooms, NP  sulfamethoxazole-trimethoprim (BACTRIM DS) 800-160 MG tablet Take 1 tablet by mouth 2 (two) times daily. Take 1 bid 03/31/19   Derrek Monaco A, NP      Allergies    Cephalosporins    Review of Systems   Review of Systems  Constitutional:  Positive for fever (Subjective). Negative for chills.  HENT:  Positive for congestion and  rhinorrhea. Negative for drooling, ear pain, sore throat and trouble swallowing.   Eyes:  Negative for photophobia, discharge and visual disturbance.  Respiratory:  Positive for cough and shortness of breath.   Cardiovascular:  Negative for chest pain and palpitations.  Gastrointestinal:  Negative for abdominal pain, diarrhea, nausea and vomiting.  Genitourinary:  Positive for dysuria, frequency and urgency. Negative for hematuria.  Musculoskeletal:  Positive for myalgias. Negative for arthralgias, back pain and joint swelling.  Skin:  Negative for color change and rash.  Neurological:  Negative for syncope and weakness.    Physical Exam Updated Vital Signs BP 128/70   Pulse 98   Temp 99.3 F (37.4 C) (Oral)   Resp 18   Ht 5\' 4"  (1.626 m)   Wt 86.2 kg   LMP 09/07/2022   SpO2 98%   BMI 32.61 kg/m  Physical Exam Vitals and nursing note reviewed.  Constitutional:      General: She is not in acute distress.    Appearance: She is not toxic-appearing.  HENT:     Head: Normocephalic and atraumatic.     Right Ear: Tympanic membrane, ear canal and external ear normal.     Left Ear: Tympanic membrane, ear canal and external ear normal.     Nose:     Comments: Bilateral nasal turbinate edema and erythema with scant clear nasal discharge.    Mouth/Throat:     Mouth: Mucous membranes are moist.  Comments: No pharyngeal erythema, exudate, or edema noted.  Uvula midline.  Airway patent.  Moist mucous membranes. Eyes:     General: No scleral icterus.    Conjunctiva/sclera: Conjunctivae normal.  Cardiovascular:     Rate and Rhythm: Normal rate.  Pulmonary:     Effort: Pulmonary effort is normal. No respiratory distress.     Comments: Mild expiratory wheeze heard in bilateral lower bases.  She is satting well on room air without any increased work of breathing.  Speaking in full sentences with ease.  No accessory muscle use, nasal flaring, tripoding, cyanosis, or respiratory distress  noted. Abdominal:     General: There is no distension.     Palpations: Abdomen is soft.     Tenderness: There is no abdominal tenderness. There is no right CVA tenderness, left CVA tenderness, guarding or rebound.  Musculoskeletal:        General: No deformity.     Cervical back: Normal range of motion.  Skin:    Findings: No rash.  Neurological:     General: No focal deficit present.     Mental Status: She is alert.     Comments: Slight facial droop noted on the left.  Involving the eyelid and left corner of smile.  She reports that this is at her baseline whenever she is tired or sick.  She has no pain on movement of her eyes.     ED Results / Procedures / Treatments   Labs (all labs ordered are listed, but only abnormal results are displayed) Labs Reviewed  URINALYSIS, ROUTINE W REFLEX MICROSCOPIC - Abnormal; Notable for the following components:      Result Value   APPearance CLOUDY (*)    Hgb urine dipstick MODERATE (*)    Protein, ur 100 (*)    Nitrite POSITIVE (*)    Leukocytes,Ua MODERATE (*)    WBC, UA >50 (*)    Bacteria, UA RARE (*)    All other components within normal limits  RESP PANEL BY RT-PCR (RSV, FLU A&B, COVID)  [id number]  URINE CULTURE  PREGNANCY, URINE    EKG None  Radiology DG Chest Portable 1 View  Result Date: 09/27/2022 CLINICAL DATA:  Pna?. Patient said she has been having a cough for a while EXAM: PORTABLE CHEST 1 VIEW COMPARISON:  None Available. FINDINGS: The heart and mediastinal contours are within normal limits. No focal consolidation. No pulmonary edema. No pleural effusion. No pneumothorax. No acute osseous abnormality. IMPRESSION: No active disease. Electronically Signed   By: Tish Frederickson M.D.   On: 09/27/2022 19:46    Procedures Procedures   Medications Ordered in ED Medications  acetaminophen (TYLENOL) tablet 650 mg (650 mg Oral Given 09/27/22 1816)  ipratropium-albuterol (DUONEB) 0.5-2.5 (3) MG/3ML nebulizer solution 3 mL (3 mLs  Nebulization Given 09/27/22 2137)    ED Course/ Medical Decision Making/ A&P    Medical Decision Making Amount and/or Complexity of Data Reviewed Labs: ordered. Radiology: ordered.  Risk OTC drugs. Prescription drug management.   42 year old female presents emerged from today for evaluation of cough and cold symptoms as well as urinary symptoms.  Differential diagnosis includes is limited to pneumonia, bronchitis, COVID, flu, RSV, viral illness, UTI, cystitis, interstitial cystitis, pyelonephritis, hydro nephritis, PID.  Vital signs initially showed patient was tachycardic and febrile at 102.1 with some mild elevated blood pressure 134/94.  After Tylenol, patient's blood pressure improved as well as her tachycardia and fever.  Last set of vitals shows normotensive, afebrile, normal  pulse rate, satting well on room air without increased work of breathing.  Physical exam as noted above.  With patient's wheezing, DuoNeb ordered.  I independent reviewed and interpreted the patient's labs.  Negative for COVID, flu, RSV.  Pregnancy test is negative.  Urinalysis does show cloudy urine with mod amount of hemoglobin.  It is nitrite positive with leukocytes and greater than 50 white blood cells with rare bacteria.  There is also white blood cells present.  Urine culture ordered and is pending.  Patient reports that she is usually takes Macrobid for her UTIs.  Will prescribe for this.  Her lung sounds are improved after the DuoNeb.  Her vital signs have improved after the Tylenol.  She does not have any CVA tenderness, I doubt any pyelonephritis at this time.  Her fever started with her cough and cold symptoms today last yesterday.  There is a urine culture pending.  Patient's left-sided facial droop is at her baseline whenever she is tired or ill.  She denies any significant worsening that has been in the previous decade.  Patient is safe for discharge home with supportive care measures for flulike  symptoms and also Macrobid for her UTI.  We discussed return precautions and red flag symptoms.  Patient verbalized understanding agrees the plan.  Patient is stable being discharged home in good condition.  I discussed this case with my attending physician who cosigned this note including patient's presenting symptoms, physical exam, and planned diagnostics and interventions. Attending physician stated agreement with plan or made changes to plan which were implemented.   Final Clinical Impression(s) / ED Diagnoses Final diagnoses:  Viral URI  Acute cystitis without hematuria    Rx / DC Orders ED Discharge Orders          Ordered    nitrofurantoin, macrocrystal-monohydrate, (MACROBID) 100 MG capsule  2 times daily        09/27/22 2304    albuterol (VENTOLIN HFA) 108 (90 Base) MCG/ACT inhaler  Every 6 hours PRN        09/27/22 2305              Sherrell Puller, PA-C 09/28/22 0013    Sherrell Puller, PA-C 09/28/22 0013    Jeanell Sparrow, DO 09/30/22 2226

## 2022-09-27 NOTE — Discharge Instructions (Addendum)
You were seen in the ER today for evaluation of a cough and cold symptoms as well as your dysuria.  It was discovered that you have a UTI from your urinalysis.  For this, prescribed you some Macrobid for you to take twice daily for the next 5 days.  Please make sure you are drinking plenty of water and still well-hydrated.  Additionally, you tested negative for COVID, flu, RSV.  This is likely a viral illness.  I am sending you home with an albuterol inhaler to take as needed for your coughing.  You can try over-the-counter cough and cold medication as well.  If you have any concerns, new or worsening symptoms, please return to the nearest emergency room for evaluation.  Get help if: Your symptoms last for 10 days or longer. Your symptoms get worse over time. You have very bad pain in your face or forehead. Parts of your jaw or neck get very swollen. You have shortness of breath. Get help right away if: You feel pain or pressure in your chest. You have trouble breathing. You faint or feel like you will faint. You keep vomiting and it gets worse. You feel confused. These symptoms may be an emergency. Get help right away. Call your local emergency services (911 in the [place] Do not wait to see if the symptoms will go away. Do not drive yourself to the hospital.

## 2022-09-27 NOTE — ED Notes (Signed)
X-ray at bedside

## 2022-09-27 NOTE — ED Triage Notes (Signed)
Pt reports fever, body aches, cough and left eye swelling since last night. Exposure to the flu.

## 2022-09-27 NOTE — ED Notes (Signed)
Pt d/c home per MD order. Discharge summary reviewed with pt, pt verbalizes understanding. Ambulatory off unit. No s/s of acute distress noted at discharge.,  °

## 2022-09-29 DIAGNOSIS — R110 Nausea: Secondary | ICD-10-CM | POA: Diagnosis not present

## 2022-09-29 DIAGNOSIS — N390 Urinary tract infection, site not specified: Secondary | ICD-10-CM | POA: Diagnosis not present

## 2022-09-29 DIAGNOSIS — J069 Acute upper respiratory infection, unspecified: Secondary | ICD-10-CM | POA: Diagnosis not present

## 2022-09-30 DIAGNOSIS — E7800 Pure hypercholesterolemia, unspecified: Secondary | ICD-10-CM | POA: Diagnosis not present

## 2022-09-30 DIAGNOSIS — R509 Fever, unspecified: Secondary | ICD-10-CM | POA: Diagnosis not present

## 2022-09-30 DIAGNOSIS — N3289 Other specified disorders of bladder: Secondary | ICD-10-CM | POA: Diagnosis not present

## 2022-09-30 DIAGNOSIS — D84821 Immunodeficiency due to drugs: Secondary | ICD-10-CM | POA: Diagnosis not present

## 2022-09-30 DIAGNOSIS — Z888 Allergy status to other drugs, medicaments and biological substances status: Secondary | ICD-10-CM | POA: Diagnosis not present

## 2022-09-30 DIAGNOSIS — Z9851 Tubal ligation status: Secondary | ICD-10-CM | POA: Diagnosis not present

## 2022-09-30 DIAGNOSIS — Z9049 Acquired absence of other specified parts of digestive tract: Secondary | ICD-10-CM | POA: Diagnosis not present

## 2022-09-30 DIAGNOSIS — Z8249 Family history of ischemic heart disease and other diseases of the circulatory system: Secondary | ICD-10-CM | POA: Diagnosis not present

## 2022-09-30 DIAGNOSIS — Z833 Family history of diabetes mellitus: Secondary | ICD-10-CM | POA: Diagnosis not present

## 2022-09-30 DIAGNOSIS — Z1152 Encounter for screening for COVID-19: Secondary | ICD-10-CM | POA: Diagnosis not present

## 2022-09-30 DIAGNOSIS — K439 Ventral hernia without obstruction or gangrene: Secondary | ICD-10-CM | POA: Diagnosis not present

## 2022-09-30 DIAGNOSIS — L4050 Arthropathic psoriasis, unspecified: Secondary | ICD-10-CM | POA: Diagnosis not present

## 2022-09-30 DIAGNOSIS — N2889 Other specified disorders of kidney and ureter: Secondary | ICD-10-CM | POA: Diagnosis not present

## 2022-09-30 DIAGNOSIS — Z8489 Family history of other specified conditions: Secondary | ICD-10-CM | POA: Diagnosis not present

## 2022-09-30 DIAGNOSIS — N12 Tubulo-interstitial nephritis, not specified as acute or chronic: Secondary | ICD-10-CM | POA: Diagnosis not present

## 2022-09-30 DIAGNOSIS — Z801 Family history of malignant neoplasm of trachea, bronchus and lung: Secondary | ICD-10-CM | POA: Diagnosis not present

## 2022-09-30 DIAGNOSIS — N10 Acute tubulo-interstitial nephritis: Secondary | ICD-10-CM | POA: Diagnosis not present

## 2022-09-30 DIAGNOSIS — Z79899 Other long term (current) drug therapy: Secondary | ICD-10-CM | POA: Diagnosis not present

## 2022-09-30 DIAGNOSIS — F17210 Nicotine dependence, cigarettes, uncomplicated: Secondary | ICD-10-CM | POA: Diagnosis not present

## 2022-09-30 DIAGNOSIS — G510 Bell's palsy: Secondary | ICD-10-CM | POA: Diagnosis not present

## 2022-09-30 LAB — URINE CULTURE: Culture: >=100000 — AB

## 2022-10-01 ENCOUNTER — Telehealth (HOSPITAL_BASED_OUTPATIENT_CLINIC_OR_DEPARTMENT_OTHER): Payer: Self-pay | Admitting: *Deleted

## 2022-10-01 DIAGNOSIS — N12 Tubulo-interstitial nephritis, not specified as acute or chronic: Secondary | ICD-10-CM | POA: Diagnosis not present

## 2022-10-01 NOTE — Telephone Encounter (Signed)
Post ED Visit - Positive Culture Follow-up  Culture report reviewed by antimicrobial stewardship pharmacist: [facility] Team []  Elenor Quinones, Pharm.D. []  Heide Guile, Pharm.D., BCPS AQ-ID []  Parks Neptune, Pharm.D., BCPS []  Alycia Rossetti, Pharm.D., BCPS []  [place], Pharm.D., BCPS, AAHIVP []  Legrand Como, Pharm.D., BCPS, AAHIVP []  Salome Arnt, PharmD, BCPS []  Johnnette Gourd, PharmD, BCPS []  Hughes Better, PharmD, BCPS []  Leeroy Cha, PharmD []  Laqueta Linden, PharmD, BCPS [x]  Franchot Gallo, PharmD  [facility] Team []  Leodis Sias, PharmD []  Lindell Spar, PharmD []  Royetta Asal, PharmD []  Graylin Shiver, Rph []  Rema Fendt) Glennon Mac, PharmD []  Arlyn Dunning, PharmD []  Netta Cedars, PharmD []  Dia Sitter, PharmD []  Leone Haven, PharmD []  Gretta Arab, PharmD []  Theodis Shove, PharmD []  Peggyann Juba, PharmD []  Reuel Boom, PharmD   Positive urine culture Treated with Nitrofurantoin, organism sensitive to the same and no further patient follow-up is required at this time.  Rosie Fate 10/01/2022, 1:32 PM

## 2022-10-05 DIAGNOSIS — G510 Bell's palsy: Secondary | ICD-10-CM | POA: Diagnosis not present

## 2022-10-05 DIAGNOSIS — N10 Acute tubulo-interstitial nephritis: Secondary | ICD-10-CM | POA: Diagnosis not present

## 2022-12-09 DIAGNOSIS — N390 Urinary tract infection, site not specified: Secondary | ICD-10-CM | POA: Diagnosis not present

## 2022-12-09 DIAGNOSIS — Z6833 Body mass index (BMI) 33.0-33.9, adult: Secondary | ICD-10-CM | POA: Diagnosis not present

## 2022-12-09 DIAGNOSIS — E669 Obesity, unspecified: Secondary | ICD-10-CM | POA: Diagnosis not present

## 2022-12-11 ENCOUNTER — Emergency Department (HOSPITAL_COMMUNITY)
Admission: EM | Admit: 2022-12-11 | Discharge: 2022-12-11 | Discharge status: Against Medical Advice | Payer: BC Managed Care – PPO | Attending: Emergency Medicine | Admitting: Emergency Medicine

## 2022-12-11 DIAGNOSIS — Z5321 Procedure and treatment not carried out due to patient leaving prior to being seen by health care provider: Secondary | ICD-10-CM | POA: Diagnosis not present

## 2022-12-11 DIAGNOSIS — Z0279 Encounter for issue of other medical certificate: Secondary | ICD-10-CM | POA: Diagnosis not present

## 2022-12-11 NOTE — ED Triage Notes (Signed)
Wife separated from husband and staying else where.  She is going tomorrow to take restraining order out on him.  He is going to [facility] tomorrow to IVS her.  She thought if she came here and saw MD herself she would avoid him doing that.

## 2022-12-11 NOTE — ED Triage Notes (Signed)
Pt talked to mom on cell (who is ER nurse) and she has decided to leave and not see MD

## 2023-01-19 DIAGNOSIS — Z113 Encounter for screening for infections with a predominantly sexual mode of transmission: Secondary | ICD-10-CM | POA: Diagnosis not present

## 2023-01-19 DIAGNOSIS — Z202 Contact with and (suspected) exposure to infections with a predominantly sexual mode of transmission: Secondary | ICD-10-CM | POA: Diagnosis not present

## 2023-01-19 DIAGNOSIS — N390 Urinary tract infection, site not specified: Secondary | ICD-10-CM | POA: Diagnosis not present

## 2023-01-19 DIAGNOSIS — F17210 Nicotine dependence, cigarettes, uncomplicated: Secondary | ICD-10-CM | POA: Diagnosis not present

## 2023-01-19 DIAGNOSIS — R309 Painful micturition, unspecified: Secondary | ICD-10-CM | POA: Diagnosis not present

## 2023-02-24 DIAGNOSIS — N61 Mastitis without abscess: Secondary | ICD-10-CM | POA: Diagnosis not present

## 2023-04-26 DIAGNOSIS — Z113 Encounter for screening for infections with a predominantly sexual mode of transmission: Secondary | ICD-10-CM | POA: Diagnosis not present

## 2023-04-26 DIAGNOSIS — R5383 Other fatigue: Secondary | ICD-10-CM | POA: Diagnosis not present

## 2023-04-26 DIAGNOSIS — F319 Bipolar disorder, unspecified: Secondary | ICD-10-CM | POA: Diagnosis not present

## 2023-05-17 ENCOUNTER — Ambulatory Visit: Payer: BC Managed Care – PPO | Admitting: Obstetrics and Gynecology

## 2023-07-11 DIAGNOSIS — N390 Urinary tract infection, site not specified: Secondary | ICD-10-CM | POA: Diagnosis not present

## 2023-07-11 DIAGNOSIS — R300 Dysuria: Secondary | ICD-10-CM | POA: Diagnosis not present

## 2023-08-16 DIAGNOSIS — N760 Acute vaginitis: Secondary | ICD-10-CM | POA: Diagnosis not present

## 2023-10-23 DIAGNOSIS — F17210 Nicotine dependence, cigarettes, uncomplicated: Secondary | ICD-10-CM | POA: Diagnosis not present

## 2023-11-13 DIAGNOSIS — Z113 Encounter for screening for infections with a predominantly sexual mode of transmission: Secondary | ICD-10-CM | POA: Diagnosis not present

## 2023-12-21 DIAGNOSIS — S63299A Dislocation of distal interphalangeal joint of unspecified finger, initial encounter: Secondary | ICD-10-CM | POA: Diagnosis not present

## 2023-12-21 DIAGNOSIS — S63297A Dislocation of distal interphalangeal joint of left little finger, initial encounter: Secondary | ICD-10-CM | POA: Diagnosis not present

## 2023-12-21 DIAGNOSIS — Z0130 Encounter for examination of blood pressure without abnormal findings: Secondary | ICD-10-CM | POA: Diagnosis not present

## 2023-12-21 DIAGNOSIS — S62637A Displaced fracture of distal phalanx of left little finger, initial encounter for closed fracture: Secondary | ICD-10-CM | POA: Diagnosis not present

## 2023-12-21 DIAGNOSIS — S6000XA Contusion of unspecified finger without damage to nail, initial encounter: Secondary | ICD-10-CM | POA: Diagnosis not present

## 2023-12-21 DIAGNOSIS — W2105XA Struck by basketball, initial encounter: Secondary | ICD-10-CM | POA: Diagnosis not present

## 2024-03-02 DIAGNOSIS — N760 Acute vaginitis: Secondary | ICD-10-CM | POA: Diagnosis not present

## 2024-03-02 DIAGNOSIS — N3000 Acute cystitis without hematuria: Secondary | ICD-10-CM | POA: Diagnosis not present

## 2024-04-13 ENCOUNTER — Emergency Department (HOSPITAL_COMMUNITY)
Admission: EM | Admit: 2024-04-13 | Discharge: 2024-04-13 | Disposition: A | Discharge status: Home / Self Care | Payer: Worker's Compensation | Source: Ambulatory Visit | Attending: Emergency Medicine | Admitting: Emergency Medicine

## 2024-04-13 DIAGNOSIS — M5442 Lumbago with sciatica, left side: Secondary | ICD-10-CM | POA: Diagnosis not present

## 2024-04-13 DIAGNOSIS — M545 Low back pain, unspecified: Secondary | ICD-10-CM | POA: Diagnosis not present

## 2024-04-13 LAB — PREGNANCY, URINE: Preg Test, Ur: NEGATIVE

## 2024-04-13 MED ORDER — LIDOCAINE 5 % EX PTCH: 1.0000 | MEDICATED_PATCH | CUTANEOUS | 0 refills | Status: AC

## 2024-04-13 MED ORDER — PREDNISONE 10 MG (21) PO TBPK: ORAL_TABLET | Freq: Every day | ORAL | 0 refills | Status: AC

## 2024-04-13 MED ORDER — MELOXICAM 15 MG PO TABS: 15.0000 mg | ORAL_TABLET | Freq: Every day | ORAL | 0 refills | Status: AC

## 2024-04-13 MED ORDER — PREDNISONE 10 MG PO TABS
60.0000 mg | ORAL_TABLET | Freq: Once | ORAL | Status: AC
  Administered 2024-04-13: 60 mg via ORAL
  Filled 2024-04-13: qty 1

## 2024-04-13 MED ORDER — KETOROLAC TROMETHAMINE 60 MG/2ML IM SOLN
30.0000 mg | Freq: Once | INTRAMUSCULAR | Status: AC
  Administered 2024-04-13: 30 mg via INTRAMUSCULAR
  Filled 2024-04-13: qty 2

## 2024-04-13 MED ORDER — METHOCARBAMOL 500 MG PO TABS: 1000.0000 mg | ORAL_TABLET | Freq: Three times a day (TID) | ORAL | 0 refills | Status: AC | PRN

## 2024-04-13 MED ORDER — METHOCARBAMOL 500 MG PO TABS
1000.0000 mg | ORAL_TABLET | Freq: Once | ORAL | Status: AC
  Administered 2024-04-13: 1000 mg via ORAL
  Filled 2024-04-13: qty 2

## 2024-04-13 NOTE — ED Provider Notes (Signed)
 [place] EMERGENCY DEPARTMENT AT [facility] Provider Note   CSN: [id number] Arrival date & time: 04/13/24  0740     Patient presents with: Back Pain   Kaitlyn Carpenter is a 43 y.o. female.    Back Pain Patient presents for back pain.  Medical history includes psoriasis.  She is currently not on any medication for her psoriasis.  She has had mild intermittent back pain in the past.  Last night, she was in her normal state of health.  While at work, at nursing home, she was moving a resident in the bed.  She had a straining movement and experienced acute onset of left lower back pain with radiation down left leg.  Pain is worsened with ambulation and positional changes.  She denies any numbness, weakness, or difficulty urinating.      Prior to Admission medications   Medication Sig Start Date End Date Taking? Authorizing Provider  lidocaine  (LIDODERM ) 5 % Place 1 patch onto the skin daily. Remove & Discard patch within 12 hours or as directed by MD 04/13/24  Yes Melvenia Motto, MD  meloxicam  (MOBIC ) 15 MG tablet Take 1 tablet (15 mg total) by mouth daily for 10 days. 04/13/24 04/23/24 Yes Melvenia Motto, MD  methocarbamol  (ROBAXIN ) 500 MG tablet Take 2 tablets (1,000 mg total) by mouth every 8 (eight) hours as needed. 04/13/24  Yes Melvenia Motto, MD  predniSONE  (STERAPRED UNI-PAK 21 TAB) 10 MG (21) TBPK tablet Take by mouth daily. Take 6 tabs by mouth daily  for 2 days, then 5 tabs for 2 days, then 4 tabs for 2 days, then 3 tabs for 2 days, 2 tabs for 2 days, then 1 tab by mouth daily for 2 days 04/13/24  Yes Melvenia Motto, MD  albuterol  (VENTOLIN  HFA) 108 (90 Base) MCG/ACT inhaler Inhale 1-2 puffs into the lungs every 6 (six) hours as needed for wheezing or shortness of breath. 09/27/22   Bernis Ernst, PA-C  clobetasol  ointment (TEMOVATE ) 0.05 % Apply 1 application topically 2 (two) times daily. 03/31/19   Signa Delon LABOR, NP  FLUoxetine  (PROZAC ) 20 MG capsule Take 1 daily 2 weeks before  period for PMS 03/31/19   Signa Delon A, NP  nitrofurantoin , macrocrystal-monohydrate, (MACROBID ) 100 MG capsule Take 1 capsule (100 mg total) by mouth 2 (two) times daily. 09/27/22   Bernis Ernst, PA-C  sulfamethoxazole -trimethoprim  (BACTRIM  DS) 800-160 MG tablet Take 1 tablet by mouth 2 (two) times daily. Take 1 bid 03/31/19   Signa Delon LABOR, NP    Allergies: Cephalosporins    Review of Systems  Musculoskeletal:  Positive for back pain.  All other systems reviewed and are negative.   Updated Vital Signs BP 118/84   Pulse 76   Temp 98.1 F (36.7 C) (Oral)   Resp 16   Ht 5' 4 (1.626 m)   Wt 80.7 kg   SpO2 96%   BMI 30.55 kg/m   Physical Exam Vitals and nursing note reviewed.  Constitutional:      General: She is not in acute distress.    Appearance: Normal appearance. She is well-developed. She is not ill-appearing, toxic-appearing or diaphoretic.  HENT:     Head: Normocephalic and atraumatic.     Right Ear: External ear normal.     Left Ear: External ear normal.     Nose: Nose normal.     Mouth/Throat:     Mouth: Mucous membranes are moist.  Eyes:     Extraocular Movements: Extraocular movements  intact.     Conjunctiva/sclera: Conjunctivae normal.  Cardiovascular:     Rate and Rhythm: Normal rate and regular rhythm.  Pulmonary:     Effort: Pulmonary effort is normal. No respiratory distress.  Abdominal:     General: There is no distension.     Palpations: Abdomen is soft.     Tenderness: There is no abdominal tenderness.  Musculoskeletal:        General: Tenderness present. No swelling or deformity.     Cervical back: Normal range of motion and neck supple.  Skin:    General: Skin is warm and dry.     Coloration: Skin is not jaundiced or pale.  Neurological:     General: No focal deficit present.     Mental Status: She is alert and oriented to person, place, and time.     Cranial Nerves: No cranial nerve deficit.     Sensory: No sensory deficit.      Motor: No weakness.     Coordination: Coordination normal.  Psychiatric:        Mood and Affect: Mood normal.        Behavior: Behavior normal.     (all labs ordered are listed, but only abnormal results are displayed) Labs Reviewed  PREGNANCY, URINE    EKG: None  Radiology: No results found.   Procedures   Medications Ordered in the ED  ketorolac  (TORADOL ) injection 30 mg (30 mg Intramuscular Given 04/13/24 0926)  predniSONE  (DELTASONE ) tablet 60 mg (60 mg Oral Given 04/13/24 0925)  methocarbamol  (ROBAXIN ) tablet 1,000 mg (1,000 mg Oral Given 04/13/24 9074)                                    Medical Decision Making Amount and/or Complexity of Data Reviewed Labs: ordered.  Risk Prescription drug management.   Patient presents for acute onset of left lower back pain.  She denies any red flag symptoms.  She is well-appearing on exam.  She has tenderness to area of L5 on left side.  She has positive ipsilateral and contralateral straight leg test.  She has no neurologic deficits.  Multimodal pain control was ordered and prescribed.  She was discharged in stable condition.     Final diagnoses:  Acute left-sided low back pain with left-sided sciatica    ED Discharge Orders          Ordered    predniSONE  (STERAPRED UNI-PAK 21 TAB) 10 MG (21) TBPK tablet  Daily        04/13/24 0937    meloxicam  (MOBIC ) 15 MG tablet  Daily        04/13/24 0937    lidocaine  (LIDODERM ) 5 %  Every 24 hours        04/13/24 0937    methocarbamol  (ROBAXIN ) 500 MG tablet  Every 8 hours PRN        04/13/24 0937               Melvenia Motto, MD 04/13/24 [id number]

## 2024-04-13 NOTE — Discharge Instructions (Addendum)
 Prescriptions were sent to your pharmacy: -Prednisone  is a steroid.  Take in tapering dose as prescribed. -Meloxicam  is an NSAID medication.  Take this in place of over-the-counter NSAIDs. -Lidocaine  patches can be switched daily. -Methocarbamol  as a muscle relaxer.  Take as needed.  Follow-up with your PCP.  Return to the emergency department for any new or worsening symptoms of concern.

## 2024-04-13 NOTE — ED Triage Notes (Signed)
 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 Pt c/o lower back pain radiating down her L leg. Pt denies saddle anesthesia or bowel/bladder incontinence.

## 2024-04-29 DIAGNOSIS — L918 Other hypertrophic disorders of the skin: Secondary | ICD-10-CM | POA: Diagnosis not present

## 2024-04-29 DIAGNOSIS — L400 Psoriasis vulgaris: Secondary | ICD-10-CM | POA: Diagnosis not present

## 2024-04-29 DIAGNOSIS — Z79899 Other long term (current) drug therapy: Secondary | ICD-10-CM | POA: Diagnosis not present

## 2024-04-29 DIAGNOSIS — L4059 Other psoriatic arthropathy: Secondary | ICD-10-CM | POA: Diagnosis not present

## 2024-04-29 DIAGNOSIS — D225 Melanocytic nevi of trunk: Secondary | ICD-10-CM | POA: Diagnosis not present

## 2024-05-02 DIAGNOSIS — F319 Bipolar disorder, unspecified: Secondary | ICD-10-CM | POA: Diagnosis not present

## 2024-05-02 DIAGNOSIS — Z6832 Body mass index (BMI) 32.0-32.9, adult: Secondary | ICD-10-CM | POA: Diagnosis not present

## 2024-05-02 DIAGNOSIS — Z23 Encounter for immunization: Secondary | ICD-10-CM | POA: Diagnosis not present

## 2024-05-02 DIAGNOSIS — Z0001 Encounter for general adult medical examination with abnormal findings: Secondary | ICD-10-CM | POA: Diagnosis not present

## 2024-05-02 DIAGNOSIS — E559 Vitamin D deficiency, unspecified: Secondary | ICD-10-CM | POA: Diagnosis not present

## 2024-05-02 DIAGNOSIS — L4050 Arthropathic psoriasis, unspecified: Secondary | ICD-10-CM | POA: Diagnosis not present

## 2024-05-02 DIAGNOSIS — Z0000 Encounter for general adult medical examination without abnormal findings: Secondary | ICD-10-CM | POA: Diagnosis not present

## 2024-05-02 DIAGNOSIS — R399 Unspecified symptoms and signs involving the genitourinary system: Secondary | ICD-10-CM | POA: Diagnosis not present

## 2024-05-02 DIAGNOSIS — E782 Mixed hyperlipidemia: Secondary | ICD-10-CM | POA: Diagnosis not present

## 2024-05-29 DIAGNOSIS — L4059 Other psoriatic arthropathy: Secondary | ICD-10-CM | POA: Diagnosis not present

## 2024-05-29 DIAGNOSIS — L400 Psoriasis vulgaris: Secondary | ICD-10-CM | POA: Diagnosis not present

## 2024-06-26 DIAGNOSIS — J029 Acute pharyngitis, unspecified: Secondary | ICD-10-CM | POA: Diagnosis not present

## 2024-06-26 DIAGNOSIS — Z0130 Encounter for examination of blood pressure without abnormal findings: Secondary | ICD-10-CM | POA: Diagnosis not present

## 2024-06-26 DIAGNOSIS — R300 Dysuria: Secondary | ICD-10-CM | POA: Diagnosis not present

## 2024-06-26 DIAGNOSIS — N390 Urinary tract infection, site not specified: Secondary | ICD-10-CM | POA: Diagnosis not present

## 2024-06-27 DIAGNOSIS — Z0130 Encounter for examination of blood pressure without abnormal findings: Secondary | ICD-10-CM | POA: Diagnosis not present

## 2024-06-27 DIAGNOSIS — N390 Urinary tract infection, site not specified: Secondary | ICD-10-CM | POA: Diagnosis not present

## 2024-07-29 DIAGNOSIS — L4059 Other psoriatic arthropathy: Secondary | ICD-10-CM | POA: Diagnosis not present

## 2024-07-29 DIAGNOSIS — L400 Psoriasis vulgaris: Secondary | ICD-10-CM | POA: Diagnosis not present

## 2024-07-29 DIAGNOSIS — Z79899 Other long term (current) drug therapy: Secondary | ICD-10-CM | POA: Diagnosis not present

## 2024-08-04 DIAGNOSIS — E559 Vitamin D deficiency, unspecified: Secondary | ICD-10-CM | POA: Diagnosis not present

## 2024-08-04 DIAGNOSIS — E782 Mixed hyperlipidemia: Secondary | ICD-10-CM | POA: Diagnosis not present

## 2024-08-06 DIAGNOSIS — Z713 Dietary counseling and surveillance: Secondary | ICD-10-CM | POA: Diagnosis not present

## 2024-08-06 DIAGNOSIS — Z6832 Body mass index (BMI) 32.0-32.9, adult: Secondary | ICD-10-CM | POA: Diagnosis not present

## 2024-08-06 DIAGNOSIS — L4050 Arthropathic psoriasis, unspecified: Secondary | ICD-10-CM | POA: Diagnosis not present

## 2024-08-06 DIAGNOSIS — E782 Mixed hyperlipidemia: Secondary | ICD-10-CM | POA: Diagnosis not present

## 2024-08-22 DIAGNOSIS — M79641 Pain in right hand: Secondary | ICD-10-CM | POA: Diagnosis not present

## 2024-08-22 DIAGNOSIS — M79672 Pain in left foot: Secondary | ICD-10-CM | POA: Diagnosis not present

## 2024-08-22 DIAGNOSIS — M549 Dorsalgia, unspecified: Secondary | ICD-10-CM | POA: Diagnosis not present

## 2024-08-22 DIAGNOSIS — M25562 Pain in left knee: Secondary | ICD-10-CM | POA: Diagnosis not present

## 2024-08-22 DIAGNOSIS — L409 Psoriasis, unspecified: Secondary | ICD-10-CM | POA: Diagnosis not present

## 2024-08-22 DIAGNOSIS — M25561 Pain in right knee: Secondary | ICD-10-CM | POA: Diagnosis not present

## 2024-08-22 DIAGNOSIS — M2550 Pain in unspecified joint: Secondary | ICD-10-CM | POA: Diagnosis not present

## 2024-08-22 DIAGNOSIS — L4050 Arthropathic psoriasis, unspecified: Secondary | ICD-10-CM | POA: Diagnosis not present

## 2024-08-22 DIAGNOSIS — M79671 Pain in right foot: Secondary | ICD-10-CM | POA: Diagnosis not present

## 2024-08-22 DIAGNOSIS — M79642 Pain in left hand: Secondary | ICD-10-CM | POA: Diagnosis not present

## 2024-08-22 DIAGNOSIS — Z79899 Other long term (current) drug therapy: Secondary | ICD-10-CM | POA: Diagnosis not present

## 2024-09-08 DIAGNOSIS — B779 Ascariasis, unspecified: Secondary | ICD-10-CM | POA: Diagnosis not present
